# Patient Record
Sex: Female | Born: 1990 | Race: White | Hispanic: No | Marital: Married | Smoking: Never smoker
Health system: Southern US, Community
[De-identification: ages and names within clinical notes are randomized; demographics above are authoritative.]

## PROBLEM LIST (undated history)

## (undated) DIAGNOSIS — Z87442 Personal history of urinary calculi: Secondary | ICD-10-CM

## (undated) DIAGNOSIS — Z862 Personal history of diseases of the blood and blood-forming organs and certain disorders involving the immune mechanism: Secondary | ICD-10-CM

## (undated) DIAGNOSIS — K589 Irritable bowel syndrome without diarrhea: Secondary | ICD-10-CM

## (undated) DIAGNOSIS — Z8632 Personal history of gestational diabetes: Secondary | ICD-10-CM

## (undated) DIAGNOSIS — R011 Cardiac murmur, unspecified: Secondary | ICD-10-CM

## (undated) DIAGNOSIS — Z8759 Personal history of other complications of pregnancy, childbirth and the puerperium: Secondary | ICD-10-CM

## (undated) DIAGNOSIS — Z8679 Personal history of other diseases of the circulatory system: Secondary | ICD-10-CM

## (undated) DIAGNOSIS — K580 Irritable bowel syndrome with diarrhea: Secondary | ICD-10-CM

## (undated) DIAGNOSIS — N200 Calculus of kidney: Secondary | ICD-10-CM

## (undated) DIAGNOSIS — Z87410 Personal history of cervical dysplasia: Secondary | ICD-10-CM

## (undated) DIAGNOSIS — L700 Acne vulgaris: Secondary | ICD-10-CM

## (undated) DIAGNOSIS — K219 Gastro-esophageal reflux disease without esophagitis: Secondary | ICD-10-CM

---
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
## 2010-07-23 LAB — URINALYSIS, ROUTINE W REFLEX MICROSCOPIC
Bilirubin Urine: NEGATIVE
Glucose, UA: NEGATIVE mg/dL
Hgb urine dipstick: NEGATIVE
Ketones, ur: NEGATIVE mg/dL
Nitrite: NEGATIVE
Protein, ur: NEGATIVE mg/dL
Specific Gravity, Urine: 1.024 (ref 1.005–1.030)
Urobilinogen, UA: 0.2 mg/dL (ref 0.0–1.0)
pH: 6 (ref 5.0–8.0)

## 2015-11-30 ENCOUNTER — Emergency Department (HOSPITAL_BASED_OUTPATIENT_CLINIC_OR_DEPARTMENT_OTHER)
Admission: EM | Admit: 2015-11-30 | Discharge: 2015-11-30 | Disposition: A | Discharge status: Home / Self Care | Payer: Managed Care, Other (non HMO) | Attending: Emergency Medicine | Admitting: Emergency Medicine

## 2015-11-30 ENCOUNTER — Encounter (HOSPITAL_BASED_OUTPATIENT_CLINIC_OR_DEPARTMENT_OTHER): Payer: Self-pay | Admitting: *Deleted

## 2015-11-30 ENCOUNTER — Emergency Department (HOSPITAL_BASED_OUTPATIENT_CLINIC_OR_DEPARTMENT_OTHER): Payer: Managed Care, Other (non HMO)

## 2015-11-30 DIAGNOSIS — R1032 Left lower quadrant pain: Secondary | ICD-10-CM

## 2015-11-30 DIAGNOSIS — O26891 Other specified pregnancy related conditions, first trimester: Secondary | ICD-10-CM | POA: Diagnosis not present

## 2015-11-30 DIAGNOSIS — R102 Pelvic and perineal pain: Secondary | ICD-10-CM | POA: Diagnosis not present

## 2015-11-30 DIAGNOSIS — Z3490 Encounter for supervision of normal pregnancy, unspecified, unspecified trimester: Secondary | ICD-10-CM

## 2015-11-30 LAB — CBC WITH DIFFERENTIAL/PLATELET
Basophils Absolute: 0.1 10*3/uL (ref 0.0–0.1)
Basophils Relative: 1 %
Eosinophils Absolute: 0.3 10*3/uL (ref 0.0–0.7)
Eosinophils Relative: 3 %
HCT: 37.1 % (ref 36.0–46.0)
Hemoglobin: 13.1 g/dL (ref 12.0–15.0)
Lymphocytes Relative: 21 %
Lymphs Abs: 1.9 10*3/uL (ref 0.7–4.0)
MCH: 29.2 pg (ref 26.0–34.0)
MCHC: 35.3 g/dL (ref 30.0–36.0)
MCV: 82.6 fL (ref 78.0–100.0)
Monocytes Absolute: 0.4 10*3/uL (ref 0.1–1.0)
Monocytes Relative: 4 %
Neutro Abs: 6.6 10*3/uL (ref 1.7–7.7)
Neutrophils Relative %: 71 %
Platelets: 220 10*3/uL (ref 150–400)
RBC: 4.49 MIL/uL (ref 3.87–5.11)
RDW: 12.4 % (ref 11.5–15.5)
WBC: 9.2 10*3/uL (ref 4.0–10.5)

## 2015-11-30 LAB — URINE MICROSCOPIC-ADD ON
RBC / HPF: NONE SEEN RBC/hpf (ref 0–5)
WBC, UA: NONE SEEN WBC/hpf (ref 0–5)

## 2015-11-30 LAB — URINALYSIS, ROUTINE W REFLEX MICROSCOPIC
Bilirubin Urine: NEGATIVE
Glucose, UA: NEGATIVE mg/dL
Hgb urine dipstick: NEGATIVE
Ketones, ur: NEGATIVE mg/dL
Leukocytes, UA: NEGATIVE
Nitrite: NEGATIVE
Protein, ur: 30 mg/dL — AB
Specific Gravity, Urine: 1.022 (ref 1.005–1.030)
pH: 8.5 — ABNORMAL HIGH (ref 5.0–8.0)

## 2015-11-30 LAB — WET PREP, GENITAL
Clue Cells Wet Prep HPF POC: NONE SEEN
Sperm: NONE SEEN
Trich, Wet Prep: NONE SEEN
Yeast Wet Prep HPF POC: NONE SEEN

## 2015-11-30 LAB — COMPREHENSIVE METABOLIC PANEL
ALT: 11 U/L — ABNORMAL LOW (ref 14–54)
AST: 17 U/L (ref 15–41)
Albumin: 4.4 g/dL (ref 3.5–5.0)
Alkaline Phosphatase: 44 U/L (ref 38–126)
Anion gap: 7 (ref 5–15)
BUN: 9 mg/dL (ref 6–20)
CO2: 25 mmol/L (ref 22–32)
Calcium: 8.9 mg/dL (ref 8.9–10.3)
Chloride: 105 mmol/L (ref 101–111)
Creatinine, Ser: 0.57 mg/dL (ref 0.44–1.00)
GFR calc Af Amer: >60 mL/min (ref >60)
GFR calc non Af Amer: >60 mL/min (ref >60)
Glucose, Bld: 98 mg/dL (ref 65–99)
Potassium: 3.6 mmol/L (ref 3.5–5.1)
Sodium: 137 mmol/L (ref 135–145)
Total Bilirubin: 0.4 mg/dL (ref 0.3–1.2)
Total Protein: 7.1 g/dL (ref 6.5–8.1)

## 2015-11-30 LAB — PREGNANCY, URINE: Preg Test, Ur: POSITIVE — AB

## 2015-11-30 LAB — HCG, QUANTITATIVE, PREGNANCY: hCG, Beta Chain, Quant, S: 622 m[IU]/mL — ABNORMAL HIGH (ref <5)

## 2015-11-30 MED ORDER — SODIUM CHLORIDE 0.9 % IV BOLUS (SEPSIS)
1000.0000 mL | Freq: Once | INTRAVENOUS | Status: AC
  Administered 2015-11-30: 1000 mL via INTRAVENOUS

## 2015-11-30 NOTE — Discharge Instructions (Signed)
You have been seen today for abdominal pain. Your pregnancy test was positive. Your other lab results showed no significant abnormalities. The ultrasound showed an intrauterine pregnancy. There is some free fluid in the pelvis with a variety of possible causes. Recommend following up with OB/GYN as soon as possible to establish prenatal care as well as quantitative hCG monitoring. It is very important that you see the OB/GYN within the next 2 weeks. [facility] to [facility] should symptoms worsen or become more frequent.

## 2015-11-30 NOTE — ED Provider Notes (Signed)
[facility] Provider Note   CSN: [id number] Arrival date & time: 11/30/15  1613     History   Chief Complaint Chief Complaint  Patient presents with  . Abdominal Pain    HPI April Charles is a 25 y.o. female.  HPI   April Charles is a 25 y.o. female, patient with no pertinent past medical history, presenting to the ED with abdominal pain that began last week. Patient states that the pain is intermittent, sharp, located in the lower left quadrant, moderate to severe, radiating to the lower pelvis. Currently pain-free. There were 2 definite instances of this pain over the last week that the patient can remember. Patient states that she went to urgent care today, had a positive pregnancy test, and was told to come to the ED for further evaluation. Has never been pregnant before. Endorses some intermittent spotting last week. LMP April 1. Denies fever/chills, N/V/C/D, urinary complaints, abnormal vaginal discharge, or any other complaints.     History reviewed. No pertinent past medical history.  There are no active problems to display for this patient.   History reviewed. No pertinent surgical history.  OB History    No data available       Home Medications    Prior to Admission medications   Not on File    Family History No family history on file.  Social History Social History  Substance Use Topics  . Smoking status: Never Smoker  . Smokeless tobacco: Never Used  . Alcohol use No     Allergies   Review of patient's allergies indicates no known allergies.   Review of Systems Review of Systems  Constitutional: Negative for chills, diaphoresis and fever.  Gastrointestinal: Positive for abdominal pain. Negative for blood in stool, constipation, diarrhea, nausea and vomiting.  Genitourinary: Positive for pelvic pain. Negative for dysuria, flank pain, hematuria and vaginal discharge.  Neurological: Negative for dizziness and light-headedness.    All other systems reviewed and are negative.    Physical Exam Updated Vital Signs BP 144/93 (BP Location: Left Arm)   Pulse 82   Temp 99.1 F (37.3 C) (Oral)   Resp 18   Ht 5' (1.524 m)   Wt 51.7 kg   LMP 11/23/2015 Comment: she had a postive preg test [facility] today  SpO2 100%   BMI 22.26 kg/m   Physical Exam  Constitutional: She appears well-developed and well-nourished. No distress.  HENT:  Head: Normocephalic and atraumatic.  Eyes: Conjunctivae are normal.  Neck: Neck supple.  Cardiovascular: Normal rate, regular rhythm, normal heart sounds and intact distal pulses.   Pulmonary/Chest: Effort normal and breath sounds normal. No respiratory distress.  Abdominal: Soft. There is no tenderness. There is no guarding.  No pain currently. No tenderness to palpation.  Genitourinary:  Genitourinary Comments: External genitalia normal Vagina with discharge - a blend of thick, green discharge with some dark blood. Cervix  friable. Mucous plug appears to be intact. negative for cervical motion tenderness Adnexa palpated, no masses or negative for tenderness noted Bladder palpated negative for tenderness Uterus palpated no masses or negative for tenderness Otherwise normal female genitalia. RN, Windell Mouldinguth, served as chaperone during exam.  Musculoskeletal: She exhibits no edema or tenderness.  Lymphadenopathy:    She has no cervical adenopathy.  Neurological: She is alert.  Skin: Skin is warm and dry. She is not diaphoretic.  Psychiatric: She has a normal mood and affect. Her behavior is normal.  Nursing note and vitals reviewed.  ED Treatments / Results  Labs (all labs ordered are listed, but only abnormal results are displayed) Labs Reviewed  WET PREP, GENITAL - Abnormal; Notable for the following:       Result Value   WBC, Wet Prep HPF POC MODERATE (*)    All other components within normal limits  URINALYSIS, ROUTINE W REFLEX MICROSCOPIC (NOT AT [facility]) - Abnormal;  Notable for the following:    APPearance CLOUDY (*)    pH 8.5 (*)    Protein, ur 30 (*)    All other components within normal limits  PREGNANCY, URINE - Abnormal; Notable for the following:    Preg Test, Ur POSITIVE (*)    All other components within normal limits  HCG, QUANTITATIVE, PREGNANCY - Abnormal; Notable for the following:    hCG, Beta Chain, Quant, S 622 (*)    All other components within normal limits  COMPREHENSIVE METABOLIC PANEL - Abnormal; Notable for the following:    ALT 11 (*)    All other components within normal limits  URINE MICROSCOPIC-ADD ON - Abnormal; Notable for the following:    Squamous Epithelial / LPF 0-5 (*)    Bacteria, UA RARE (*)    All other components within normal limits  CBC WITH DIFFERENTIAL/PLATELET  GC/CHLAMYDIA PROBE AMP ([place]) NOT AT [facility]    EKG  EKG Interpretation None       Radiology [place] Ob Comp Less 14 Wks  Result Date: 11/30/2015 CLINICAL DATA:  Left pelvic pain.  First-trimester pregnancy. EXAM: OBSTETRIC <14 WK US AND TRANSVAGINAL OB US TECHNIQUE: Both transabdominal and transvaginal ultrasound examinations were performed for complete evaluation of the gestation as well as the maternal uterus, adnexal regions, and pelvic cul-de-sac. Transvaginal technique was performed to assess early pregnancy. COMPARISON:  None. FINDINGS: Intrauterine gestational sac: An intrauterine gestational sac is present. Yolk sac:  None visualized Embryo:  Not visualized Cardiac Activity: Not visualized MSD: 4.8  mm   5 w   2  d Subchorionic hemorrhage:  None visualized. Maternal uterus/adnexae: The uterus is retroverted. Multiple follicles are present in both ovaries. A large amount of free fluid is present. The uterus is retroverted. IMPRESSION: 1. Probable early intrauterine gestational sac, but no yolk sac, fetal pole, or cardiac activity yet visualized. Recommend follow-up quantitative B-HCG levels and follow-up US in 14 days to confirm and assess  viability. This recommendation follows SRU consensus guidelines: Diagnostic Criteria for Nonviable Pregnancy Early in the First Trimester. Malva Limes Engl J Med 2013; 161:0960-45; 369:1443-51. 2. Large amount free fluid may be physiologic. Electronically Signed   By: Marin Robertshristopher  Mattern M.D.   On: 11/30/2015 18:33   [place] Ob Transvaginal  Result Date: 11/30/2015 CLINICAL DATA:  Left pelvic pain.  First-trimester pregnancy. EXAM: OBSTETRIC <14 WK US AND TRANSVAGINAL OB US TECHNIQUE: Both transabdominal and transvaginal ultrasound examinations were performed for complete evaluation of the gestation as well as the maternal uterus, adnexal regions, and pelvic cul-de-sac. Transvaginal technique was performed to assess early pregnancy. COMPARISON:  None. FINDINGS: Intrauterine gestational sac: An intrauterine gestational sac is present. Yolk sac:  None visualized Embryo:  Not visualized Cardiac Activity: Not visualized MSD: 4.8  mm   5 w   2  d Subchorionic hemorrhage:  None visualized. Maternal uterus/adnexae: The uterus is retroverted. Multiple follicles are present in both ovaries. A large amount of free fluid is present. The uterus is retroverted. IMPRESSION: 1. Probable early intrauterine gestational sac, but no yolk sac, fetal pole, or cardiac activity yet visualized.  Recommend follow-up quantitative B-HCG levels and follow-up US in 14 days to confirm and assess viability. This recommendation follows SRU consensus guidelines: Diagnostic Criteria for Nonviable Pregnancy Early in the First Trimester. Malva Limes Med 2013; 161:0960-45. 2. Large amount free fluid may be physiologic. Electronically Signed   By: Marin Roberts M.D.   On: 11/30/2015 18:33    Procedures Procedures (including critical care time)  Medications Ordered in ED Medications  sodium chloride 0.9 % bolus 1,000 mL (0 mLs Intravenous Stopped 11/30/15 2000)     Initial Impression / Assessment and Plan / ED Course  I have reviewed the triage vital signs and  the nursing notes.  Pertinent labs & imaging results that were available during my care of the patient were reviewed by me and considered in my medical decision making (see chart for details).  Clinical Course    Kaityln Kallstrom presents with intermittent lower left abdominal pain over the last week.  Findings and plan of care discussed with Loren Racer, MD.   Rule out ectopic. Doubt ovarian torsion due to timeline. Patient has a benign abdominal exam. Pelvic exam findings as above. Patient's findings are not consistent with typical ectopic pregnancy. Intrauterine pregnancy noted on ultrasound. Patient is hemodynamically stable. Patient to follow-up with OB/GYN as soon as possible. The patient was given instructions for home care as well as return precautions. Patient voices understanding of these instructions, accepts the plan, and is comfortable with discharge.   Vitals:   11/30/15 1619 11/30/15 1621 11/30/15 1825  BP:  144/93 119/79  Pulse:  82 85  Resp:  18 18  Temp:  99.1 F (37.3 C)   TempSrc:  Oral   SpO2:  100% 100%  Weight: 51.7 kg    Height: 5' (1.524 m)        Final Clinical Impressions(s) / ED Diagnoses   Final diagnoses:  Left lower quadrant pain  Pregnancy    New Prescriptions New Prescriptions   No medications on file     Concepcion Living 11/30/15 2012    Loren Racer, MD 12/04/15 [phone]

## 2015-11-30 NOTE — ED Triage Notes (Signed)
Abdominal pain once last week. The pain came back today. The pain is throbbing in her lower abdomen.

## 2015-12-01 LAB — GC/CHLAMYDIA PROBE AMP (~~LOC~~) NOT AT ARMC
Chlamydia: NEGATIVE
Neisseria Gonorrhea: NEGATIVE

## 2015-12-10 ENCOUNTER — Ambulatory Visit (HOSPITAL_COMMUNITY)
Admission: RE | Admit: 2015-12-10 | Discharge: 2015-12-10 | Disposition: A | Discharge status: Home / Self Care | Payer: Managed Care, Other (non HMO) | Source: Ambulatory Visit | Attending: Obstetrics and Gynecology | Admitting: Obstetrics and Gynecology

## 2015-12-10 ENCOUNTER — Encounter (HOSPITAL_COMMUNITY)
Admission: RE | Disposition: A | Discharge status: Home / Self Care | Payer: Self-pay | Source: Ambulatory Visit | Attending: Obstetrics and Gynecology

## 2015-12-10 ENCOUNTER — Encounter (HOSPITAL_COMMUNITY): Payer: Self-pay | Admitting: *Deleted

## 2015-12-10 ENCOUNTER — Ambulatory Visit (HOSPITAL_COMMUNITY): Payer: Managed Care, Other (non HMO) | Admitting: Anesthesiology

## 2015-12-10 DIAGNOSIS — O001 Tubal pregnancy without intrauterine pregnancy: Secondary | ICD-10-CM | POA: Insufficient documentation

## 2015-12-10 DIAGNOSIS — O009 Unspecified ectopic pregnancy without intrauterine pregnancy: Secondary | ICD-10-CM

## 2015-12-10 DIAGNOSIS — N803 Endometriosis of pelvic peritoneum: Secondary | ICD-10-CM | POA: Insufficient documentation

## 2015-12-10 DIAGNOSIS — Z9079 Acquired absence of other genital organ(s): Secondary | ICD-10-CM

## 2015-12-10 HISTORY — DX: Cardiac murmur, unspecified: R01.1

## 2015-12-10 HISTORY — PX: LAPAROSCOPY: SHX197

## 2015-12-10 HISTORY — DX: Irritable bowel syndrome without diarrhea: K58.9

## 2015-12-10 LAB — CBC
HCT: 38.3 % (ref 36.0–46.0)
Hemoglobin: 13.5 g/dL (ref 12.0–15.0)
MCH: 28.7 pg (ref 26.0–34.0)
MCHC: 35.2 g/dL (ref 30.0–36.0)
MCV: 81.3 fL (ref 78.0–100.0)
Platelets: 220 10*3/uL (ref 150–400)
RBC: 4.71 MIL/uL (ref 3.87–5.11)
RDW: 12.8 % (ref 11.5–15.5)
WBC: 10.1 10*3/uL (ref 4.0–10.5)

## 2015-12-10 LAB — TYPE AND SCREEN
ABO/RH(D): O POS
Antibody Screen: NEGATIVE

## 2015-12-10 LAB — ABO/RH: ABO/RH(D): O POS

## 2015-12-10 SURGERY — LAPAROSCOPY, DIAGNOSTIC
Anesthesia: General

## 2015-12-10 MED ORDER — MEPERIDINE HCL 25 MG/ML IJ SOLN: 6.2500 mg | INTRAMUSCULAR | Status: DC | PRN

## 2015-12-10 MED ORDER — LIDOCAINE HCL (CARDIAC) 20 MG/ML IV SOLN
INTRAVENOUS | Status: DC | PRN
  Administered 2015-12-10: 60 mg via INTRAVENOUS

## 2015-12-10 MED ORDER — PROPOFOL 10 MG/ML IV BOLUS
INTRAVENOUS | Status: AC
  Filled 2015-12-10: qty 20

## 2015-12-10 MED ORDER — HYDROMORPHONE HCL 1 MG/ML IJ SOLN
INTRAMUSCULAR | Status: DC | PRN
  Administered 2015-12-10: 1 mg via INTRAVENOUS

## 2015-12-10 MED ORDER — MIDAZOLAM HCL 2 MG/2ML IJ SOLN
INTRAMUSCULAR | Status: DC | PRN
  Administered 2015-12-10: 2 mg via INTRAVENOUS

## 2015-12-10 MED ORDER — DEXAMETHASONE SODIUM PHOSPHATE 10 MG/ML IJ SOLN
INTRAMUSCULAR | Status: DC | PRN
  Administered 2015-12-10: 5 mg via INTRAVENOUS

## 2015-12-10 MED ORDER — LIDOCAINE HCL (CARDIAC) 20 MG/ML IV SOLN
INTRAVENOUS | Status: AC
  Filled 2015-12-10: qty 5

## 2015-12-10 MED ORDER — FENTANYL CITRATE (PF) 100 MCG/2ML IJ SOLN
INTRAMUSCULAR | Status: AC
  Filled 2015-12-10: qty 2

## 2015-12-10 MED ORDER — LACTATED RINGERS IV SOLN
INTRAVENOUS | Status: DC
  Administered 2015-12-10 (×2): via INTRAVENOUS

## 2015-12-10 MED ORDER — SCOPOLAMINE 1 MG/3DAYS TD PT72
MEDICATED_PATCH | TRANSDERMAL | Status: AC
  Administered 2015-12-10: 1.5 mg via TRANSDERMAL
  Filled 2015-12-10: qty 1

## 2015-12-10 MED ORDER — SUGAMMADEX SODIUM 500 MG/5ML IV SOLN
INTRAVENOUS | Status: AC
  Filled 2015-12-10: qty 5

## 2015-12-10 MED ORDER — SUGAMMADEX SODIUM 200 MG/2ML IV SOLN
INTRAVENOUS | Status: DC | PRN
  Administered 2015-12-10: 200 mg via INTRAVENOUS

## 2015-12-10 MED ORDER — LACTATED RINGERS IV SOLN: INTRAVENOUS | Status: DC

## 2015-12-10 MED ORDER — DEXAMETHASONE SODIUM PHOSPHATE 4 MG/ML IJ SOLN
INTRAMUSCULAR | Status: AC
  Filled 2015-12-10: qty 1

## 2015-12-10 MED ORDER — ONDANSETRON HCL 4 MG/2ML IJ SOLN
INTRAMUSCULAR | Status: AC
  Filled 2015-12-10: qty 2

## 2015-12-10 MED ORDER — OXYCODONE-ACETAMINOPHEN 5-325 MG PO TABS: 1.0000 | ORAL_TABLET | ORAL | 0 refills | Status: DC | PRN

## 2015-12-10 MED ORDER — PROPOFOL 10 MG/ML IV BOLUS
INTRAVENOUS | Status: DC | PRN
  Administered 2015-12-10: 40 mg via INTRAVENOUS
  Administered 2015-12-10: 160 mg via INTRAVENOUS

## 2015-12-10 MED ORDER — METOCLOPRAMIDE HCL 5 MG/ML IJ SOLN: 10.0000 mg | Freq: Once | INTRAMUSCULAR | Status: DC | PRN

## 2015-12-10 MED ORDER — ROCURONIUM BROMIDE 100 MG/10ML IV SOLN
INTRAVENOUS | Status: DC | PRN
  Administered 2015-12-10: 10 mg via INTRAVENOUS
  Administered 2015-12-10: 30 mg via INTRAVENOUS

## 2015-12-10 MED ORDER — FENTANYL CITRATE (PF) 100 MCG/2ML IJ SOLN
INTRAMUSCULAR | Status: DC | PRN
  Administered 2015-12-10: 50 ug via INTRAVENOUS
  Administered 2015-12-10: 100 ug via INTRAVENOUS
  Administered 2015-12-10: 50 ug via INTRAVENOUS

## 2015-12-10 MED ORDER — MIDAZOLAM HCL 2 MG/2ML IJ SOLN
INTRAMUSCULAR | Status: AC
  Filled 2015-12-10: qty 2

## 2015-12-10 MED ORDER — SUGAMMADEX SODIUM 200 MG/2ML IV SOLN
INTRAVENOUS | Status: AC
  Filled 2015-12-10: qty 2

## 2015-12-10 MED ORDER — BUPIVACAINE HCL (PF) 0.25 % IJ SOLN
INTRAMUSCULAR | Status: DC | PRN
  Administered 2015-12-10: 10 mL

## 2015-12-10 MED ORDER — SCOPOLAMINE 1 MG/3DAYS TD PT72
1.0000 | MEDICATED_PATCH | Freq: Once | TRANSDERMAL | Status: DC
  Administered 2015-12-10: 1.5 mg via TRANSDERMAL

## 2015-12-10 MED ORDER — HYDROMORPHONE HCL 1 MG/ML IJ SOLN
INTRAMUSCULAR | Status: AC
  Filled 2015-12-10: qty 1

## 2015-12-10 MED ORDER — FENTANYL CITRATE (PF) 100 MCG/2ML IJ SOLN: 25.0000 ug | INTRAMUSCULAR | Status: DC | PRN

## 2015-12-10 MED ORDER — ONDANSETRON HCL 4 MG/2ML IJ SOLN
INTRAMUSCULAR | Status: DC | PRN
  Administered 2015-12-10: 4 mg via INTRAVENOUS

## 2015-12-10 MED ORDER — ROCURONIUM BROMIDE 100 MG/10ML IV SOLN
INTRAVENOUS | Status: AC
  Filled 2015-12-10: qty 1

## 2015-12-10 MED ORDER — DEXAMETHASONE SODIUM PHOSPHATE 10 MG/ML IJ SOLN
INTRAMUSCULAR | Status: AC
  Filled 2015-12-10: qty 6

## 2015-12-10 MED ORDER — KETOROLAC TROMETHAMINE 30 MG/ML IJ SOLN
INTRAMUSCULAR | Status: DC | PRN
  Administered 2015-12-10: 30 mg via INTRAVENOUS

## 2015-12-10 SURGICAL SUPPLY — 35 items
BAG SPEC RTRVL LRG 6X4 10 (ENDOMECHANICALS) ×1
CABLE HIGH FREQUENCY MONO STRZ (ELECTRODE) IMPLANT
CATH ROBINSON RED A/P 16FR (CATHETERS) ×1 IMPLANT
CLOTH BEACON ORANGE TIMEOUT ST (SAFETY) ×2 IMPLANT
DECANTER SPIKE VIAL GLASS SM (MISCELLANEOUS) ×2 IMPLANT
DRSG COVADERM PLUS 2X2 (GAUZE/BANDAGES/DRESSINGS) ×2 IMPLANT
DRSG OPSITE POSTOP 3X4 (GAUZE/BANDAGES/DRESSINGS) ×1 IMPLANT
DURAPREP 26ML APPLICATOR (WOUND CARE) ×2 IMPLANT
FILTER SMOKE EVAC LAPAROSHD (FILTER) ×1 IMPLANT
GLOVE BIO SURGEON STRL SZ 6.5 (GLOVE) ×2 IMPLANT
GLOVE BIOGEL PI IND STRL 7.0 (GLOVE) ×1 IMPLANT
GLOVE BIOGEL PI INDICATOR 7.0 (GLOVE) ×1
GOWN STRL REUS W/TWL LRG LVL3 (GOWN DISPOSABLE) ×4 IMPLANT
LIQUID BAND (GAUZE/BANDAGES/DRESSINGS) ×2 IMPLANT
NEEDLE INSUFFLATION 120MM (ENDOMECHANICALS) ×1 IMPLANT
NS IRRIG 1000ML POUR BTL (IV SOLUTION) ×2 IMPLANT
PACK LAPAROSCOPY BASIN (CUSTOM PROCEDURE TRAY) ×2 IMPLANT
PAD TRENDELENBURG POSITION (MISCELLANEOUS) ×2 IMPLANT
POUCH SPECIMEN RETRIEVAL 10MM (ENDOMECHANICALS) ×1 IMPLANT
SET IRRIG TUBING LAPAROSCOPIC (IRRIGATION / IRRIGATOR) IMPLANT
SHEARS HARMONIC ACE PLUS 36CM (ENDOMECHANICALS) ×1 IMPLANT
SLEEVE XCEL OPT CAN 5 100 (ENDOMECHANICALS) ×2 IMPLANT
SOLUTION ELECTROLUBE (MISCELLANEOUS) IMPLANT
SUT MNCRL AB 4-0 PS2 18 (SUTURE) ×1 IMPLANT
SUT VIC AB 3-0 PS2 18 (SUTURE)
SUT VIC AB 3-0 PS2 18XBRD (SUTURE) ×1 IMPLANT
SUT VICRYL 0 UR6 27IN ABS (SUTURE) ×1 IMPLANT
SUT VICRYL 4-0 PS2 18IN ABS (SUTURE) ×1 IMPLANT
SYRINGE 60CC LL (MISCELLANEOUS) ×1 IMPLANT
SYSTEM CARTER THOMASON II (TROCAR) ×1 IMPLANT
TOWEL OR 17X24 6PK STRL BLUE (TOWEL DISPOSABLE) ×4 IMPLANT
TROCAR XCEL NON-BLD 11X100MML (ENDOMECHANICALS) ×1 IMPLANT
TROCAR XCEL NON-BLD 5MMX100MML (ENDOMECHANICALS) ×1 IMPLANT
WARMER LAPAROSCOPE (MISCELLANEOUS) ×2 IMPLANT
WATER STERILE IRR 1000ML POUR (IV SOLUTION) ×2 IMPLANT

## 2015-12-10 NOTE — Anesthesia Postprocedure Evaluation (Signed)
Anesthesia Post Note  Patient: April BradleyStacey Charles  Procedure(s) Performed: Procedure(s) (LRB): Operative Laparoscopy, Left Salpingectomy (N/A)  Patient location during evaluation: PACU Anesthesia Type: General Level of consciousness: awake and alert Pain management: pain level controlled Vital Signs Assessment: post-procedure vital signs reviewed and stable Respiratory status: spontaneous breathing, nonlabored ventilation and respiratory function stable Cardiovascular status: blood pressure returned to baseline and stable Postop Assessment: no signs of nausea or vomiting Anesthetic complications: no     Last Vitals:  Vitals:   12/10/15 1830 12/10/15 1845  BP: 117/77 118/77  Pulse: 86 83  Resp: 10 12  Temp:      Last Pain:  Vitals:   12/10/15 1524  TempSrc: Oral   Pain Goal: Patients Stated Pain Goal: 3 (12/10/15 1524)               [name]

## 2015-12-10 NOTE — Discharge Planning (Signed)
Pt felt great in PACU. Pt offered to stay overnight for extended observation vs discharge to home. Pt would like to go home tonight. Family agrees.  Instructions reviewed Rx for percocet and ibuprofen given Call office to schedule for f/u appt in a week All questions answered

## 2015-12-10 NOTE — Anesthesia Procedure Notes (Signed)
Procedure Name: Intubation Date/Time: 12/10/2015 4:59 PM Performed by: Elbert EwingsHYMER, Kamaryn Grimley S Pre-anesthesia Checklist: Patient identified, Emergency Drugs available, Suction available, Patient being monitored and Timeout performed Patient Re-evaluated:Patient Re-evaluated prior to inductionOxygen Delivery Method: Circle system utilized Preoxygenation: Pre-oxygenation with 100% oxygen Intubation Type: IV induction Ventilation: Mask ventilation without difficulty Laryngoscope Size: Mac and 3 Grade View: Grade II Tube type: Oral Tube size: 7.0 mm Number of attempts: 1 Airway Equipment and Method: Stylet Placement Confirmation: ETT inserted through vocal cords under direct vision,  positive ETCO2 and breath sounds checked- equal and bilateral Secured at: 20 cm Tube secured with: Tape Dental Injury: Teeth and Oropharynx as per pre-operative assessment

## 2015-12-10 NOTE — Transfer of Care (Signed)
Immediate Anesthesia Transfer of Care Note  Patient: April BradleyStacey Nelis  Procedure(s) Performed: Procedure(s): Operative Laparoscopy, Left Salpingectomy (N/A)  Patient Location: PACU  Anesthesia Type:General  Level of Consciousness: awake, alert  and oriented  Airway & Oxygen Therapy: Patient Spontanous Breathing and Patient connected to nasal cannula oxygen  Post-op Assessment: Report given to RN and Post -op Vital signs reviewed and stable  Post vital signs: Reviewed and stable  Last Vitals:  Vitals:   12/10/15 1524  BP: 119/89  Pulse: 79  Resp: 18  Temp: 37.3 C    Last Pain:  Vitals:   12/10/15 1524  TempSrc: Oral      Patients Stated Pain Goal: 3 (12/10/15 1524)  Complications: No apparent anesthesia complications

## 2015-12-10 NOTE — Discharge Instructions (Signed)
DISCHARGE INSTRUCTIONS: Laparoscopy  The following instructions have been prepared to help you care for yourself upon your return home today.  Wound care:  Do not get the incision wet for the first 24 hours. The incision should be kept clean and dry.  The Band-Aids or dressings may be removed the day after surgery.  Should the incision become sore, red, and swollen after the first week, check with your doctor.  Personal hygiene:  Shower the day after your procedure. (Saturday)  Activity and limitations:  Do NOT drive or operate any equipment today.  Do NOT lift anything more than 15 pounds for 2-3 weeks after surgery.  Do NOT rest in bed all day.  Walking is encouraged. Walk each day, starting slowly with 5-minute walks 3 or 4 times a day. Slowly increase the length of your walks.  Walk up and down stairs slowly.  Do NOT do strenuous activities, such as golfing, playing tennis, bowling, running, biking, weight lifting, gardening, mowing, or vacuuming for 2-4 weeks. Ask your doctor when it is okay to start.  Diet: Eat a light meal as desired this evening. You may resume your usual diet tomorrow.  Return to work: This is dependent on the type of work you do. For the most part you can return to a desk job within a week of surgery. If you are more active at work, please discuss this with your doctor.  What to expect after your surgery: You may have a slight burning sensation when you urinate on the first day. You may have a very small amount of blood in the urine. Expect to have a small amount of vaginal discharge/light bleeding for 1-2 weeks. It is not unusual to have abdominal soreness and bruising for up to 2 weeks. You may be tired and need more rest for about 1 week. You may experience shoulder pain for 24-72 hours. Lying flat in bed may relieve it.  Call your doctor for any of the following:  Develop a fever of 100.4 or greater  Inability to urinate 6 hours after discharge  from hospital  Severe pain not relieved by pain medications  Persistent of heavy bleeding at incision site  Redness or swelling around incision site after a week  Increasing nausea or vomiting  Patient Signature________________________________________ Nurse Signature_________________________________________ Post Anesthesia Home Care Instructions  Activity: Get plenty of rest for the remainder of the day. A responsible adult should stay with you for 24 hours following the procedure.  For the next 24 hours, DO NOT: -Drive a car -Advertising copywriter -Drink alcoholic beverages -Take any medication unless instructed by your physician -Make any legal decisions or sign important papers.  Meals: Start with liquid foods such as gelatin or soup. Progress to regular foods as tolerated. Avoid greasy, spicy, heavy foods. If nausea and/or vomiting occur, drink only clear liquids until the nausea and/or vomiting subsides. Call your physician if vomiting continues.  Special Instructions/Symptoms: Your throat may feel dry or sore from the anesthesia or the breathing tube placed in your throat during surgery. If this causes discomfort, gargle with warm salt water. The discomfort should disappear within 24 hours.  If you had a scopolamine patch placed behind your ear for the management of post- operative nausea and/or vomiting:  1. The medication in the patch is effective for 72 hours, after which it should be removed.  Wrap patch in a tissue and discard in the trash. Wash hands thoroughly with soap and water. 2. You may remove the  patch earlier than 72 hours if you experience unpleasant side effects which may include dry mouth, dizziness or visual disturbances. 3. Avoid touching the patch. Wash your hands with soap and water after contact with the patch.

## 2015-12-10 NOTE — H&P (Signed)
April BradleyStacey Charles is an 25 y.o. female here for scheduled laparoscopy due to ectopic pregnancy. Pt was in office today for US to f/u abnormally rising beta. Last Tues 11/30/15 began to have left pain and went to ED. US Showed nothing definitive and beta was 600. Returned to office 12/02/15 and beta was 812 then 1460 12/05/15 then 1980 12/08/15. With this abnormally rising beta she was brought in for US today. US is c/w a left ectopic pregnancy 2x1.4x1.2 cm. There is some complex free fluid in adnexa and cul-de-sac. She is currently not having significant pain, but did last week. She has been trying to conceive for 3 years. Menses have been irregular 21-35. Pt was counselled and given option of methotrexate vs laparoscopy and she opted for laparoscopy. R/B reviewed and all questions answered  Pertinent Gynecological History: Menses: irregular occurring approximately every 21-35 days with spotting approximately 2 days per month Bleeding: none Contraception: none DES exposure: denies Blood transfusions: none Sexually transmitted diseases: no past history Previous GYN Procedures: none  Last mammogram: n/a Date:  Last pap: normal Date: 11/10/14 OB History: G0, P0   Menstrual History: Menarche age: [age] Patient's last menstrual period was 11/23/2015.    Past Medical History:  Diagnosis Date  . Heart murmur    Hx as a child, no problems as an adult  . IBS (irritable bowel syndrome)    no meds    History reviewed. No pertinent surgical history.  History reviewed. No pertinent family history.  Social History:  reports that she has never smoked. She has never used smokeless tobacco. She reports that she does not drink alcohol or use drugs.  Allergies: No Known Allergies  Prescriptions Prior to Admission  Medication Sig Dispense Refill Last Dose  . ibuprofen (ADVIL,MOTRIN) 200 MG tablet Take 200 mg by mouth every 6 (six) hours as needed for mild pain.   11/30/2015  . Prenatal Vit-Fe Fumarate-FA  (PRENATAL MULTIVITAMIN) TABS tablet Take 1 tablet by mouth daily at 12 noon.   12/09/2015 at Unknown time    Review of Systems  Constitutional: Negative for chills, fever, malaise/fatigue and weight loss.  Eyes: Negative for blurred vision.  Respiratory: Negative for shortness of breath.   Cardiovascular: Negative for chest pain.  Gastrointestinal: Positive for abdominal pain. Negative for heartburn, nausea and vomiting.  Genitourinary: Negative for dysuria.  Musculoskeletal: Negative for back pain and myalgias.  Skin: Negative for itching and rash.  Neurological: Negative for dizziness and headaches.  Endo/Heme/Allergies: Does not bruise/bleed easily.  Psychiatric/Behavioral: Negative for depression, hallucinations, substance abuse and suicidal ideas. The patient is not nervous/anxious.     Height 5' (1.524 m), weight 114 lb (51.7 kg), last menstrual period 11/23/2015. Physical Exam  Constitutional: She is oriented to person, place, and time. She appears well-developed and well-nourished.  HENT:  Head: Normocephalic.  Neck: Normal range of motion.  Cardiovascular: Normal rate.   Respiratory: Effort normal.  GI: Soft. She exhibits no distension. There is tenderness. There is no rebound and no guarding.  Musculoskeletal: Normal range of motion.  Neurological: She is alert and oriented to person, place, and time.  Skin: Skin is warm.  Psychiatric: She has a normal mood and affect. Her behavior is normal. Judgment and thought content normal.    No results found for this or any previous visit (from the past 24 hour(s)).  No results found.  Assessment/Plan: G1P0 with left adnexal mass consistent with ectopic pregnancy opting for surgical management R/B reviewed and all questions answered ;  including risk of open, injury and bleeding and loss of tube/ovary Consent confirmed TO OR for Diagnostic laparoscopy, removal of ectopic pregnancy, possible left salpingectomy, possible  open  Sharol Given Alizah Sills 12/10/2015, 3:21 PM

## 2015-12-10 NOTE — Anesthesia Preprocedure Evaluation (Signed)
Anesthesia Evaluation  Patient identified by MRN, date of birth, ID band Patient awake    Reviewed: Allergy & Precautions, NPO status , Patient's Chart, lab work & pertinent test results  Airway Mallampati: II  TM Distance: >3 FB Neck ROM: Full    Dental no notable dental hx.    Pulmonary neg pulmonary ROS,    Pulmonary exam normal breath sounds clear to auscultation       Cardiovascular negative cardio ROS Normal cardiovascular exam Rhythm:Regular Rate:Normal     Neuro/Psych negative neurological ROS  negative psych ROS   GI/Hepatic negative GI ROS, Neg liver ROS,   Endo/Other  negative endocrine ROS  Renal/GU negative Renal ROS  negative genitourinary   Musculoskeletal negative musculoskeletal ROS (+)   Abdominal   Peds negative pediatric ROS (+)  Hematology negative hematology ROS (+)   Anesthesia Other Findings   Reproductive/Obstetrics (+) Pregnancy ectopic                             Anesthesia Physical Anesthesia Plan  ASA: II  Anesthesia Plan: General   Post-op Pain Management:    Induction: Intravenous  Airway Management Planned: Oral ETT  Additional Equipment:   Intra-op Plan:   Post-operative Plan: Extubation in OR  Informed Consent: I have reviewed the patients History and Physical, chart, labs and discussed the procedure including the risks, benefits and alternatives for the proposed anesthesia with the patient or authorized representative who has indicated his/her understanding and acceptance.   Dental advisory given  Plan Discussed with: CRNA  Anesthesia Plan Comments:         Anesthesia Quick Evaluation

## 2015-12-10 NOTE — Op Note (Signed)
Operative Note    Preoperative Diagnosis: left tubal ectopic pregnancy   Postoperative Diagnosis:1.  left tubal ectopic pregnancy                                               2. endometrial implants   Procedure: Diagnostic laparoscopy with left salpingectomy   Surgeon: Dr Mindi SlickerBanga Assist:   Dr Ellyn HackBovard - Stuckert  Anesthesia: general   Fluids: LR EBL: 30cc UOP: small    Findings: Normal sized uterus and ovaries and right fallopian tube; left fallopian tube with 4cm ectopic in mid isthmus position; blood in cul de sac and noted over organs; moderate endometrial implants in posterior cul de sac and small amount in anterior cul de sac as well as behind left ovary; none behind right ovary   Specimen: left fallopian tube and ectopic pregnancy   Procedure Note  Patient was taken to the operating room where general anesthesia was obtained without difficulty in a comfortable dorsal lithotomy position. She was then prepped and draped in the normal sterile fashion. An appropriate timeout was performed. A speculum was then placed within the vagina and a Hulka tenaculum placed within the cervix for uterine manipulation. The bladder was emptied. Attention was then turned to the patient's abdomen after draping where the infraumbilicus was incised and a 5mm scope placed under direct visualization. Gas flow was then applied and a pneumoperitoneum obtained with approximate 3 L of CO2 gas. With patient in Trendelenburg the uterus and tubes and ovaries were inspected with findings as previously stated. A second 5mm scope was placed under direct visualization the right lower quadrant and a 11mm port in the left; both under direct visualization and after injection with 1/4% marcaine.  The uterus was anteverted and better assesment done which confirmed initial findings. Decision made to proceed with a salpingectomy as best surgical outcome hence the left fallopian tube and ectopic were excised using the harmonic  scalpel. An endo bag was then placed and specimen easily removed. The pelvis was was copious irrigated with saline with no bleeding noted. Both ureters were away from area of surgery.  At this time the patient was then flattened. The 11mm port site was closed using an o vicryl suture on a carter thomasen. The remaining instruments were all removed from the abdomen and the pneumoperitoneum reduced. The umbilical trocar was finally removed and the incisions closed with 4-0 Vicryl and dermabond.The hulka tenaculum was removed- hemostasis noted. Patient was then awakened and taken to the recovery room in good condition. Counts were correct

## 2015-12-14 ENCOUNTER — Encounter (HOSPITAL_COMMUNITY): Payer: Self-pay | Admitting: Obstetrics and Gynecology

## 2017-01-02 DIAGNOSIS — R1110 Vomiting, unspecified: Secondary | ICD-10-CM | POA: Diagnosis not present

## 2017-01-02 DIAGNOSIS — Z6821 Body mass index (BMI) 21.0-21.9, adult: Secondary | ICD-10-CM | POA: Diagnosis not present

## 2017-01-02 DIAGNOSIS — K529 Noninfective gastroenteritis and colitis, unspecified: Secondary | ICD-10-CM | POA: Diagnosis not present

## 2017-02-27 DIAGNOSIS — Z6822 Body mass index (BMI) 22.0-22.9, adult: Secondary | ICD-10-CM | POA: Diagnosis not present

## 2017-02-27 DIAGNOSIS — F411 Generalized anxiety disorder: Secondary | ICD-10-CM | POA: Diagnosis not present

## 2017-02-27 DIAGNOSIS — Z0000 Encounter for general adult medical examination without abnormal findings: Secondary | ICD-10-CM | POA: Diagnosis not present

## 2017-05-02 DIAGNOSIS — R946 Abnormal results of thyroid function studies: Secondary | ICD-10-CM | POA: Diagnosis not present

## 2017-05-29 DIAGNOSIS — J111 Influenza due to unidentified influenza virus with other respiratory manifestations: Secondary | ICD-10-CM | POA: Diagnosis not present

## 2017-05-29 DIAGNOSIS — J029 Acute pharyngitis, unspecified: Secondary | ICD-10-CM | POA: Diagnosis not present

## 2017-05-29 DIAGNOSIS — Z6823 Body mass index (BMI) 23.0-23.9, adult: Secondary | ICD-10-CM | POA: Diagnosis not present

## 2017-07-20 ENCOUNTER — Emergency Department (HOSPITAL_COMMUNITY)
Admission: EM | Admit: 2017-07-20 | Discharge: 2017-07-20 | Disposition: A | Discharge status: Home / Self Care | Payer: BLUE CROSS/BLUE SHIELD | Attending: Physician Assistant | Admitting: Physician Assistant

## 2017-07-20 ENCOUNTER — Emergency Department (HOSPITAL_COMMUNITY): Payer: BLUE CROSS/BLUE SHIELD

## 2017-07-20 ENCOUNTER — Encounter (HOSPITAL_COMMUNITY): Payer: Self-pay | Admitting: Emergency Medicine

## 2017-07-20 DIAGNOSIS — N23 Unspecified renal colic: Secondary | ICD-10-CM | POA: Diagnosis not present

## 2017-07-20 DIAGNOSIS — Z79899 Other long term (current) drug therapy: Secondary | ICD-10-CM | POA: Diagnosis not present

## 2017-07-20 DIAGNOSIS — N200 Calculus of kidney: Secondary | ICD-10-CM | POA: Diagnosis not present

## 2017-07-20 DIAGNOSIS — N202 Calculus of kidney with calculus of ureter: Secondary | ICD-10-CM | POA: Diagnosis not present

## 2017-07-20 DIAGNOSIS — R109 Unspecified abdominal pain: Secondary | ICD-10-CM | POA: Diagnosis not present

## 2017-07-20 DIAGNOSIS — R1032 Left lower quadrant pain: Secondary | ICD-10-CM | POA: Diagnosis present

## 2017-07-20 LAB — I-STAT CHEM 8, ED
BUN: 10 mg/dL (ref 6–20)
Calcium, Ion: 1.19 mmol/L (ref 1.15–1.40)
Chloride: 103 mmol/L (ref 101–111)
Creatinine, Ser: 0.6 mg/dL (ref 0.44–1.00)
Glucose, Bld: 93 mg/dL (ref 65–99)
HCT: 41 % (ref 36.0–46.0)
Hemoglobin: 13.9 g/dL (ref 12.0–15.0)
Potassium: 4.1 mmol/L (ref 3.5–5.1)
Sodium: 141 mmol/L (ref 135–145)
TCO2: 27 mmol/L (ref 22–32)

## 2017-07-20 LAB — URINALYSIS, ROUTINE W REFLEX MICROSCOPIC
Bilirubin Urine: NEGATIVE
Glucose, UA: NEGATIVE mg/dL
Hgb urine dipstick: NEGATIVE
Ketones, ur: NEGATIVE mg/dL
Nitrite: NEGATIVE
Protein, ur: 30 mg/dL — AB
Specific Gravity, Urine: 1.024 (ref 1.005–1.030)
pH: 6 (ref 5.0–8.0)

## 2017-07-20 LAB — POC URINE PREG, ED: Preg Test, Ur: NEGATIVE

## 2017-07-20 MED ORDER — OXYCODONE-ACETAMINOPHEN 5-325 MG PO TABS: ORAL_TABLET | ORAL | 0 refills | Status: DC

## 2017-07-20 MED ORDER — PROMETHAZINE HCL 25 MG PO TABS: 25.0000 mg | ORAL_TABLET | Freq: Four times a day (QID) | ORAL | 0 refills | Status: DC | PRN

## 2017-07-20 MED ORDER — TAMSULOSIN HCL 0.4 MG PO CAPS: 0.4000 mg | ORAL_CAPSULE | Freq: Two times a day (BID) | ORAL | 0 refills | Status: DC

## 2017-07-20 NOTE — Discharge Instructions (Addendum)
Return to the ED immediately if you develop fever, uncontrolled pain or vomiting, or other concerns. ° °

## 2017-07-20 NOTE — ED Provider Notes (Signed)
[facility]-EMERGENCY DEPT Provider Note   CSN: [id number] Arrival date & time: 07/20/17  1019     History   Chief Complaint Chief Complaint  Patient presents with  . Flank Pain    HPI April Charles is a 27 y.o. female who presents the emergency department chief complaint of flank pain.  Patient states that over the past 3 days she has had urgency of urination without much production of urine.  She is never had a urinary tract infection but thought she might have when it took over-the-counter Azo-Standard.  She is continued to have the symptoms.  This morning around 8 AM she had sudden onset of severe stabbing left-sided flank pain with associated diaphoresis and nausea.  She did not vomit.  She was unable to find a position of comfort.  This lasted for about an hour and then went away and returned again at about 10:30 AM lasting for another hour.  She has no complaints of pain at this time.  She has urinated about 3 times.  She denies fevers, chills.  She has no known history of kidney stones.  HPI  Past Medical History:  Diagnosis Date  . Heart murmur    Hx as a child, no problems as an adult  . IBS (irritable bowel syndrome)    no meds    Patient Active Problem List   Diagnosis Date Noted  . Ectopic pregnancy without intrauterine pregnancy 12/10/2015  . Hx of unilateral salpingectomy 12/10/2015    Past Surgical History:  Procedure Laterality Date  . LAPAROSCOPY N/A 12/10/2015   Procedure: Operative Laparoscopy, Left Salpingectomy;  Surgeon: Edwinna Areolaecilia Worema Banga, DO;  Location: [facility];  Service: Gynecology;  Laterality: N/A;     OB History   None      Home Medications    Prior to Admission medications   Medication Sig Start Date End Date Taking? Authorizing Provider  ibuprofen (ADVIL,MOTRIN) 200 MG tablet Take 200 mg by mouth every 6 (six) hours as needed for mild pain.    [provider]  oxyCODONE-acetaminophen (PERCOCET) 5-325 MG  tablet Take 1 tablet every 4 hours or 2 tablets every 6 hours as needed for severe pain. 07/20/17   Arthor CaptainHarris, Ala Capri, PA-C  Prenatal Vit-Fe Fumarate-FA (PRENATAL MULTIVITAMIN) TABS tablet Take 1 tablet by mouth daily at 12 noon.    [provider]  promethazine (PHENERGAN) 25 MG tablet Take 1 tablet (25 mg total) by mouth every 6 (six) hours as needed for nausea or vomiting. 07/20/17   Arthor CaptainHarris, Chanice Brenton, PA-C  tamsulosin (FLOMAX) 0.4 MG CAPS capsule Take 1 capsule (0.4 mg total) by mouth 2 (two) times daily. 07/20/17   Arthor CaptainHarris, Sheng Pritz, PA-C    Family History No family history on file.  Social History Social History   Tobacco Use  . Smoking status: Never Smoker  . Smokeless tobacco: Never Used  Substance Use Topics  . Alcohol use: No  . Drug use: No     Allergies   Patient has no known allergies.   Review of Systems Review of Systems Ten systems reviewed and are negative for acute change, except as noted in the HPI.    Physical Exam Updated Vital Signs BP 121/83 (BP Location: Left Arm)   Pulse 76   Temp 98.4 F (36.9 C) (Oral)   Resp 18   Ht 5' (1.524 m)   Wt 54 kg (119 lb)   LMP 07/15/2017 Comment: negative urine pregancy test 07-20-2017  SpO2 97%  BMI 23.24 kg/m   Physical Exam  Constitutional: She is oriented to person, place, and time. She appears well-developed and well-nourished. No distress.  HENT:  Head: Normocephalic and atraumatic.  Eyes: Conjunctivae are normal. No scleral icterus.  Neck: Normal range of motion.  Cardiovascular: Normal rate, regular rhythm and normal heart sounds. Exam reveals no gallop and no friction rub.  No murmur heard. Pulmonary/Chest: Effort normal and breath sounds normal. No respiratory distress.  Abdominal: Soft. Bowel sounds are normal. She exhibits no distension and no mass. There is no tenderness. There is no guarding.  Neurological: She is alert and oriented to person, place, and time.  Skin: Skin is warm and dry. She is  not diaphoretic.  Psychiatric: Her behavior is normal.  Nursing note and vitals reviewed.    ED Treatments / Results  Labs (all labs ordered are listed, but only abnormal results are displayed) Labs Reviewed  URINALYSIS, ROUTINE W REFLEX MICROSCOPIC - Abnormal; Notable for the following components:      Result Value   APPearance HAZY (*)    Protein, ur 30 (*)    Leukocytes, UA SMALL (*)    Bacteria, UA RARE (*)    Squamous Epithelial / LPF 6-30 (*)    All other components within normal limits  POC URINE PREG, ED  I-STAT CHEM 8, ED    EKG None  Radiology Ct Renal Stone Study  Result Date: 07/20/2017 CLINICAL DATA:  Left flank pain with urinary frequency. EXAM: CT ABDOMEN AND PELVIS WITHOUT CONTRAST TECHNIQUE: Multidetector CT imaging of the abdomen and pelvis was performed following the standard protocol without IV contrast. COMPARISON:  07/07/2006 FINDINGS: Lower chest: Unremarkable Hepatobiliary: No focal abnormality in the liver on this study without intravenous contrast. There is no evidence for gallstones, gallbladder wall thickening, or pericholecystic fluid. No intrahepatic or extrahepatic biliary dilation. Pancreas: No focal mass lesion. No dilatation of the main duct. No intraparenchymal cyst. No peripancreatic edema. Spleen: No splenomegaly. No focal mass lesion. Adrenals/Urinary Tract: No adrenal nodule or mass. No stones in the right kidney or ureter. Adjacent 1-2 mm nonobstructing stones identified lower pole left kidney. No substantial secondary changes in the left kidney or ureter although 1-2 mm distal left ureteral stone identified about 1 cm proximal to the UVJ (image 68/series 2 and sagittal image 101/series 5). No bladder stones. Stomach/Bowel: Stomach is nondistended. No gastric wall thickening. No evidence of outlet obstruction. Duodenum is normally positioned as is the ligament of Treitz. No small bowel wall thickening. No small bowel dilatation. The terminal ileum is  normal. The appendix is normal. No gross colonic mass. No colonic wall thickening. No substantial diverticular change. Vascular/Lymphatic: No abdominal aortic aneurysm. No abdominal aortic atherosclerotic calcification. There is no gastrohepatic or hepatoduodenal ligament lymphadenopathy. No intraperitoneal or retroperitoneal lymphadenopathy. No pelvic sidewall lymphadenopathy. Reproductive: The uterus has normal CT imaging appearance. There is no adnexal mass. Other: No intraperitoneal free fluid. Musculoskeletal: Bone windows reveal no worrisome lytic or sclerotic osseous lesions. IMPRESSION: 1. 1-2 mm distal left ureteral stone is identified approximately 1-2 cm proximal to the UVJ without appreciable secondary changes in the left kidney or ureter. 2. A pair of adjacent 1-2 mm stones identified in the lower pole the left kidney without obstruction. 3. Otherwise unremarkable exam. Electronically Signed   By: Kennith Center M.D.   On: 07/20/2017 15:27    Procedures Procedures (including critical care time)  Medications Ordered in ED Medications - No data to display   Initial Impression /  Assessment and Plan / ED Course  I have reviewed the triage vital signs and the nursing notes.  Pertinent labs & imaging results that were available during my care of the patient were reviewed by me and considered in my medical decision making (see chart for details).  Clinical Course as of Jul 21 1603  Fri Jul 20, 2017  1349 Normal creatinine.  I-stat chem 8, ed [AH]    Clinical Course User Index [AH] Arthor Captain, PA-C    Patient with 2 mm UVJ stone on the left and 2 kidney stones in the left kidney.  She has not had any repeat pain while she is here.  I discussed findings of the CT scan.  Her renal function appears normal.  There is no evidence of obstructive uropathy.  No evidence of infection.  I discussed the course that her pain can take.  Patient will be discharged with Percocet, Flomax and  Phenergan.  I discussed return precautions with the patient.  Final Clinical Impressions(s) / ED Diagnoses   Final diagnoses:  Ureteral colic  Kidney stone    ED Discharge Orders        Ordered    oxyCODONE-acetaminophen (PERCOCET) 5-325 MG tablet     07/20/17 1546    promethazine (PHENERGAN) 25 MG tablet  Every 6 hours PRN     07/20/17 1546    tamsulosin (FLOMAX) 0.4 MG CAPS capsule  2 times daily     07/20/17 1546       Arthor Captain, PA-C 07/20/17 1605    Mackuen, Cindee Salt, MD 07/20/17 1621

## 2017-07-20 NOTE — ED Triage Notes (Signed)
Per pt, nontraumatic left back pain-states urinary frequency-states she started taking AZO 3 days ago but doesn't think it helped-states left flank pain that started this am-thinks she might have UTI

## 2017-07-23 DIAGNOSIS — N202 Calculus of kidney with calculus of ureter: Secondary | ICD-10-CM | POA: Diagnosis not present

## 2017-07-24 DIAGNOSIS — N202 Calculus of kidney with calculus of ureter: Secondary | ICD-10-CM | POA: Diagnosis not present

## 2017-08-27 DIAGNOSIS — N202 Calculus of kidney with calculus of ureter: Secondary | ICD-10-CM | POA: Diagnosis not present

## 2017-09-13 DIAGNOSIS — N202 Calculus of kidney with calculus of ureter: Secondary | ICD-10-CM | POA: Diagnosis not present

## 2017-12-06 DIAGNOSIS — K580 Irritable bowel syndrome with diarrhea: Secondary | ICD-10-CM | POA: Diagnosis not present

## 2017-12-06 DIAGNOSIS — Z6824 Body mass index (BMI) 24.0-24.9, adult: Secondary | ICD-10-CM | POA: Diagnosis not present

## 2018-06-22 DIAGNOSIS — Z6825 Body mass index (BMI) 25.0-25.9, adult: Secondary | ICD-10-CM | POA: Diagnosis not present

## 2018-06-22 DIAGNOSIS — J101 Influenza due to other identified influenza virus with other respiratory manifestations: Secondary | ICD-10-CM | POA: Diagnosis not present

## 2018-12-27 DIAGNOSIS — Z3169 Encounter for other general counseling and advice on procreation: Secondary | ICD-10-CM | POA: Diagnosis not present

## 2018-12-27 DIAGNOSIS — N926 Irregular menstruation, unspecified: Secondary | ICD-10-CM | POA: Diagnosis not present

## 2019-01-17 ENCOUNTER — Other Ambulatory Visit: Payer: Self-pay

## 2019-01-17 DIAGNOSIS — Z20822 Contact with and (suspected) exposure to covid-19: Secondary | ICD-10-CM

## 2019-01-18 LAB — NOVEL CORONAVIRUS, NAA: SARS-CoV-2, NAA: NOT DETECTED

## 2019-01-21 ENCOUNTER — Telehealth: Payer: Self-pay | Admitting: General Practice

## 2019-01-21 NOTE — Telephone Encounter (Signed)
Pt returned call for covid result °Advised of Not Detected result.  °

## 2019-05-02 ENCOUNTER — Other Ambulatory Visit: Payer: Self-pay

## 2019-05-02 ENCOUNTER — Ambulatory Visit: Payer: BC Managed Care – PPO | Attending: Internal Medicine

## 2019-05-02 DIAGNOSIS — Z20822 Contact with and (suspected) exposure to covid-19: Secondary | ICD-10-CM

## 2019-05-02 DIAGNOSIS — U071 COVID-19: Secondary | ICD-10-CM | POA: Insufficient documentation

## 2019-05-03 LAB — NOVEL CORONAVIRUS, NAA: SARS-CoV-2, NAA: DETECTED — AB

## 2019-09-12 DIAGNOSIS — Z3181 Encounter for male factor infertility in female patient: Secondary | ICD-10-CM | POA: Diagnosis not present

## 2019-09-12 DIAGNOSIS — Z319 Encounter for procreative management, unspecified: Secondary | ICD-10-CM | POA: Diagnosis not present

## 2019-09-12 DIAGNOSIS — E288 Other ovarian dysfunction: Secondary | ICD-10-CM | POA: Diagnosis not present

## 2019-09-12 DIAGNOSIS — N912 Amenorrhea, unspecified: Secondary | ICD-10-CM | POA: Diagnosis not present

## 2019-10-07 DIAGNOSIS — Z3202 Encounter for pregnancy test, result negative: Secondary | ICD-10-CM | POA: Diagnosis not present

## 2019-10-07 DIAGNOSIS — Z3141 Encounter for fertility testing: Secondary | ICD-10-CM | POA: Diagnosis not present

## 2019-10-24 DIAGNOSIS — Z319 Encounter for procreative management, unspecified: Secondary | ICD-10-CM | POA: Diagnosis not present

## 2019-11-18 DIAGNOSIS — Z3201 Encounter for pregnancy test, result positive: Secondary | ICD-10-CM | POA: Diagnosis not present

## 2019-11-18 DIAGNOSIS — Z3200 Encounter for pregnancy test, result unknown: Secondary | ICD-10-CM | POA: Diagnosis not present

## 2019-11-19 DIAGNOSIS — Z3200 Encounter for pregnancy test, result unknown: Secondary | ICD-10-CM | POA: Diagnosis not present

## 2019-12-03 DIAGNOSIS — O0900 Supervision of pregnancy with history of infertility, unspecified trimester: Secondary | ICD-10-CM | POA: Diagnosis not present

## 2019-12-07 ENCOUNTER — Emergency Department (HOSPITAL_COMMUNITY): Payer: BC Managed Care – PPO

## 2019-12-07 ENCOUNTER — Other Ambulatory Visit: Payer: Self-pay

## 2019-12-07 ENCOUNTER — Encounter (HOSPITAL_COMMUNITY): Payer: Self-pay | Admitting: *Deleted

## 2019-12-07 ENCOUNTER — Emergency Department (HOSPITAL_COMMUNITY)
Admission: EM | Admit: 2019-12-07 | Discharge: 2019-12-07 | Disposition: A | Discharge status: Home / Self Care | Payer: BC Managed Care – PPO | Attending: Emergency Medicine | Admitting: Emergency Medicine

## 2019-12-07 DIAGNOSIS — Y9389 Activity, other specified: Secondary | ICD-10-CM | POA: Diagnosis not present

## 2019-12-07 DIAGNOSIS — Y998 Other external cause status: Secondary | ICD-10-CM | POA: Insufficient documentation

## 2019-12-07 DIAGNOSIS — S61255A Open bite of left ring finger without damage to nail, initial encounter: Secondary | ICD-10-CM | POA: Insufficient documentation

## 2019-12-07 DIAGNOSIS — Z23 Encounter for immunization: Secondary | ICD-10-CM | POA: Insufficient documentation

## 2019-12-07 DIAGNOSIS — W540XXA Bitten by dog, initial encounter: Secondary | ICD-10-CM | POA: Diagnosis not present

## 2019-12-07 DIAGNOSIS — S61250A Open bite of right index finger without damage to nail, initial encounter: Secondary | ICD-10-CM | POA: Insufficient documentation

## 2019-12-07 DIAGNOSIS — Y9289 Other specified places as the place of occurrence of the external cause: Secondary | ICD-10-CM | POA: Diagnosis not present

## 2019-12-07 DIAGNOSIS — S61230A Puncture wound without foreign body of right index finger without damage to nail, initial encounter: Secondary | ICD-10-CM | POA: Diagnosis not present

## 2019-12-07 DIAGNOSIS — S61235A Puncture wound without foreign body of left ring finger without damage to nail, initial encounter: Secondary | ICD-10-CM | POA: Diagnosis not present

## 2019-12-07 MED ORDER — DOUBLE ANTIBIOTIC 500-10000 UNIT/GM EX OINT: TOPICAL_OINTMENT | Freq: Once | CUTANEOUS | Status: DC

## 2019-12-07 MED ORDER — AMOXICILLIN-POT CLAVULANATE 875-125 MG PO TABS: 1.0000 | ORAL_TABLET | Freq: Two times a day (BID) | ORAL | 0 refills | Status: DC

## 2019-12-07 MED ORDER — TETANUS-DIPHTH-ACELL PERTUSSIS 5-2.5-18.5 LF-MCG/0.5 IM SUSP
0.5000 mL | Freq: Once | INTRAMUSCULAR | Status: AC
  Administered 2019-12-07: 0.5 mL via INTRAMUSCULAR
  Filled 2019-12-07: qty 0.5

## 2019-12-07 NOTE — ED Triage Notes (Signed)
Pt states that she was trying to break up dogs fighting when she got bitten on right index finger and left ring finger.

## 2019-12-07 NOTE — Discharge Instructions (Addendum)
Please read the attachment on animal bites.  Your tetanus has been updated here in the ED.  Please keep the wounds clean and covered.  I have prescribed you Augmentin.  If you develop any spreading redness, swelling, warmth, fevers, or other evidence of infection, it is important that you take the Augmentin antibiotics as directed.  Return to the ED or seek immediate medical attention should you experience any new or worsening symptoms.

## 2019-12-07 NOTE — ED Provider Notes (Signed)
[facility] EMERGENCY DEPARTMENT Provider Note   CSN: [id number] Arrival date & time: 12/07/19  1547     History Chief Complaint  Patient presents with  . Animal Bite    April Charles is a 29 y.o. female with no relevant past medical history presents the ED accompanied by her husband with complaints of dog bite.  She reports that her and her husband both were bitten by a pit bull while attempting to break up a fight between dogs.  She sustained minor puncture wounds to her right index and left ring finger, both wounds located near DIP.  She reports that they are nonbleeding and is not requesting pain medications at this time.  She states that she is currently [redacted] weeks gestation.  She denies any numbness, weakness, anticoagulation, bleeding disorders, or other symptoms.  Patient reports that she has not had the tetanus Boostrix injection since she was in high school.   HPI     Past Medical History:  Diagnosis Date  . Heart murmur    Hx as a child, no problems as an adult  . IBS (irritable bowel syndrome)    no meds    Patient Active Problem List   Diagnosis Date Noted  . Ectopic pregnancy without intrauterine pregnancy 12/10/2015  . Hx of unilateral salpingectomy 12/10/2015    Past Surgical History:  Procedure Laterality Date  . LAPAROSCOPY N/A 12/10/2015   Procedure: Operative Laparoscopy, Left Salpingectomy;  Surgeon: Edwinna Areola, DO;  Location: [facility];  Service: Gynecology;  Laterality: N/A;     OB History   No obstetric history on file.     No family history on file.  Social History   Tobacco Use  . Smoking status: Never Smoker  . Smokeless tobacco: Never Used  Substance Use Topics  . Alcohol use: No  . Drug use: No    Home Medications Prior to Admission medications   Medication Sig Start Date End Date Taking? Authorizing Provider  amoxicillin-clavulanate (AUGMENTIN) 875-125 MG tablet Take 1 tablet by mouth every 12 (twelve) hours. 12/07/19    Lorelee New, PA-C  ibuprofen (ADVIL,MOTRIN) 200 MG tablet Take 200 mg by mouth every 6 (six) hours as needed for mild pain.    [provider]  oxyCODONE-acetaminophen (PERCOCET) 5-325 MG tablet Take 1 tablet every 4 hours or 2 tablets every 6 hours as needed for severe pain. 07/20/17   Arthor Captain, PA-C  Prenatal Vit-Fe Fumarate-FA (PRENATAL MULTIVITAMIN) TABS tablet Take 1 tablet by mouth daily at 12 noon.    [provider]  promethazine (PHENERGAN) 25 MG tablet Take 1 tablet (25 mg total) by mouth every 6 (six) hours as needed for nausea or vomiting. 07/20/17   Arthor Captain, PA-C  tamsulosin (FLOMAX) 0.4 MG CAPS capsule Take 1 capsule (0.4 mg total) by mouth 2 (two) times daily. 07/20/17   Arthor Captain, PA-C    Allergies    Patient has no known allergies.  Review of Systems   Review of Systems  Constitutional: Negative for fever.  Musculoskeletal: Negative for arthralgias and joint swelling.  Skin: Positive for wound. Negative for color change.  Neurological: Negative for weakness and numbness.    Physical Exam Updated Vital Signs BP 124/80 (BP Location: Right Arm)   Pulse 92   Temp 98.6 F (37 C) (Oral)   Resp 16   Ht 5' (1.524 m)   Wt 59 kg   SpO2 99%   BMI 25.39 kg/m   Physical Exam Vitals  and nursing note reviewed. Exam conducted with a chaperone present.  Constitutional:      General: She is not in acute distress.    Appearance: Normal appearance. She is not ill-appearing.  HENT:     Head: Normocephalic and atraumatic.  Eyes:     General: No scleral icterus.    Conjunctiva/sclera: Conjunctivae normal.  Cardiovascular:     Rate and Rhythm: Normal rate and regular rhythm.     Pulses: Normal pulses.     Heart sounds: Normal heart sounds.  Pulmonary:     Effort: Pulmonary effort is normal. No respiratory distress.  Musculoskeletal:     Comments: Right hand: Small puncture wound near DIP on index finger.  ROM and strength intact.   Capillary refill intact.  Radial pulse intact.  Sensation intact throughout.  Hemostasis achieved.  No obvious foreign bodies. Left hand: Small puncture wound near DIP on fourth finger.  ROM and strength intact.  Capillary refill intact.  Radial pulse intact.  Sensation intact throughout.  Hemostasis achieved.  No obvious foreign bodies.  Skin:    General: Skin is dry.  Neurological:     Mental Status: She is alert.     GCS: GCS eye subscore is 4. GCS verbal subscore is 5. GCS motor subscore is 6.  Psychiatric:        Mood and Affect: Mood normal.        Behavior: Behavior normal.        Thought Content: Thought content normal.     ED Results / Procedures / Treatments   Labs (all labs ordered are listed, but only abnormal results are displayed) Labs Reviewed - No data to display  EKG None  Radiology DG Finger Index Right  Result Date: 12/07/2019 CLINICAL DATA:  Status post dog bite with puncture wounds to the right index finger. Initial encounter. EXAM: RIGHT INDEX FINGER 2+V COMPARISON:  None. FINDINGS: There is no evidence of fracture or dislocation. There is no evidence of arthropathy or other focal bone abnormality. Soft tissues are unremarkable. IMPRESSION: Negative exam. Electronically Signed   By: Drusilla Kanner M.D.   On: 12/07/2019 17:26   DG Finger Ring Left  Result Date: 12/07/2019 CLINICAL DATA:  Status post dog bite with puncture wounds of the left ring finger. Initial encounter. EXAM: LEFT RING FINGER 2+V COMPARISON:  None. FINDINGS: There is no evidence of fracture or dislocation. There is no evidence of arthropathy or other focal bone abnormality. Soft tissues are unremarkable. IMPRESSION: Negative exam. Electronically Signed   By: Drusilla Kanner M.D.   On: 12/07/2019 17:26    Procedures Procedures (including critical care time)  Medications Ordered in ED Medications  Tdap (BOOSTRIX) injection 0.5 mL (0.5 mLs Intramuscular Given 12/07/19 1734)    ED Course   I have reviewed the triage vital signs and the nursing notes.  Pertinent labs & imaging results that were available during my care of the patient were reviewed by me and considered in my medical decision making (see chart for details).    MDM Rules/Calculators/A&P                          Patient presents the ED with 2 small puncture wounds subsequent to dog bite.  Patient is [redacted] weeks gestation.  She states that her last Boostrix injection was when she was in high school.  We will update patient's tetanus here in the ED.  Will obtain imaging given the proximity  of injuries to DIP joints to evaluate for osseous injury or foreign body.  Will irrigate the wounds copiously here in the ED.   We will prescribe patient a course of Augmentin as penicillins are typically well-tolerated in pregnancy.  However, I am encouraging patient to hold off on initiation of antibiotics unless she begins to see evidence of infection.  Discussed with her that I would prefer to keep the area clean and covered and monitor for symptoms/signs of infection.  Patient and husband are both agreeable.  Patient to follow-up with her PCP/OB/GYN for ongoing evaluation management.  All of the evaluation and work-up results were discussed with the patient and any family at bedside.  Patient and/or family were informed that while patient is appropriate for discharge at this time, some medical emergencies may only develop or become detectable after a period of time.  I specifically instructed patient and/or family to return to return to the ED or seek immediate medical attention for any new or worsening symptoms.  They were provided opportunity to ask any additional questions and have none at this time.  Prior to discharge patient is feeling well, agreeable with plan for discharge home.  They have expressed understanding of verbal discharge instructions as well as return precautions and are agreeable to the plan.    Final Clinical  Impression(s) / ED Diagnoses Final diagnoses:  Dog bite, initial encounter    Rx / DC Orders ED Discharge Orders         Ordered    amoxicillin-clavulanate (AUGMENTIN) 875-125 MG tablet  Every 12 hours        12/07/19 1740           Elvera Maria 12/07/19 1740    Bethann Berkshire, MD 12/08/19 1015

## 2020-01-07 DIAGNOSIS — O26891 Other specified pregnancy related conditions, first trimester: Secondary | ICD-10-CM | POA: Diagnosis not present

## 2020-01-07 DIAGNOSIS — Z369 Encounter for antenatal screening, unspecified: Secondary | ICD-10-CM | POA: Diagnosis not present

## 2020-01-07 DIAGNOSIS — Z368A Encounter for antenatal screening for other genetic defects: Secondary | ICD-10-CM | POA: Diagnosis not present

## 2020-01-07 DIAGNOSIS — Z1151 Encounter for screening for human papillomavirus (HPV): Secondary | ICD-10-CM | POA: Diagnosis not present

## 2020-01-07 DIAGNOSIS — Z3481 Encounter for supervision of other normal pregnancy, first trimester: Secondary | ICD-10-CM | POA: Diagnosis not present

## 2020-01-07 DIAGNOSIS — Z113 Encounter for screening for infections with a predominantly sexual mode of transmission: Secondary | ICD-10-CM | POA: Diagnosis not present

## 2020-01-07 DIAGNOSIS — Z3A12 12 weeks gestation of pregnancy: Secondary | ICD-10-CM | POA: Diagnosis not present

## 2020-01-07 DIAGNOSIS — R87610 Atypical squamous cells of undetermined significance on cytologic smear of cervix (ASC-US): Secondary | ICD-10-CM | POA: Diagnosis not present

## 2020-02-04 DIAGNOSIS — R87612 Low grade squamous intraepithelial lesion on cytologic smear of cervix (LGSIL): Secondary | ICD-10-CM | POA: Diagnosis not present

## 2020-02-04 DIAGNOSIS — R87619 Unspecified abnormal cytological findings in specimens from cervix uteri: Secondary | ICD-10-CM | POA: Diagnosis not present

## 2020-02-25 ENCOUNTER — Inpatient Hospital Stay (HOSPITAL_COMMUNITY): Payer: BC Managed Care – PPO

## 2020-02-25 ENCOUNTER — Other Ambulatory Visit: Payer: Self-pay

## 2020-02-25 ENCOUNTER — Inpatient Hospital Stay (HOSPITAL_COMMUNITY)
Admission: AD | Admit: 2020-02-25 | Discharge: 2020-02-25 | Disposition: A | Discharge status: Home / Self Care | Payer: BC Managed Care – PPO | Attending: Obstetrics and Gynecology | Admitting: Obstetrics and Gynecology

## 2020-02-25 ENCOUNTER — Encounter (HOSPITAL_COMMUNITY): Payer: Self-pay | Admitting: Obstetrics and Gynecology

## 2020-02-25 DIAGNOSIS — N200 Calculus of kidney: Secondary | ICD-10-CM | POA: Diagnosis not present

## 2020-02-25 DIAGNOSIS — O99891 Other specified diseases and conditions complicating pregnancy: Secondary | ICD-10-CM | POA: Insufficient documentation

## 2020-02-25 DIAGNOSIS — Z3A19 19 weeks gestation of pregnancy: Secondary | ICD-10-CM | POA: Diagnosis not present

## 2020-02-25 DIAGNOSIS — N132 Hydronephrosis with renal and ureteral calculous obstruction: Secondary | ICD-10-CM | POA: Diagnosis not present

## 2020-02-25 DIAGNOSIS — R109 Unspecified abdominal pain: Secondary | ICD-10-CM

## 2020-02-25 DIAGNOSIS — O26832 Pregnancy related renal disease, second trimester: Secondary | ICD-10-CM

## 2020-02-25 DIAGNOSIS — Z3482 Encounter for supervision of other normal pregnancy, second trimester: Secondary | ICD-10-CM | POA: Diagnosis not present

## 2020-02-25 HISTORY — DX: Calculus of kidney: N20.0

## 2020-02-25 LAB — COMPREHENSIVE METABOLIC PANEL
ALT: 23 U/L (ref 0–44)
AST: 29 U/L (ref 15–41)
Albumin: 3.3 g/dL — ABNORMAL LOW (ref 3.5–5.0)
Alkaline Phosphatase: 53 U/L (ref 38–126)
Anion gap: 12 (ref 5–15)
BUN: 5 mg/dL — ABNORMAL LOW (ref 6–20)
CO2: 21 mmol/L — ABNORMAL LOW (ref 22–32)
Calcium: 9.6 mg/dL (ref 8.9–10.3)
Chloride: 103 mmol/L (ref 98–111)
Creatinine, Ser: 0.86 mg/dL (ref 0.44–1.00)
GFR, Estimated: >60 mL/min (ref >60)
Glucose, Bld: 118 mg/dL — ABNORMAL HIGH (ref 70–99)
Potassium: 4 mmol/L (ref 3.5–5.1)
Sodium: 136 mmol/L (ref 135–145)
Total Bilirubin: 0.7 mg/dL (ref 0.3–1.2)
Total Protein: 6.3 g/dL — ABNORMAL LOW (ref 6.5–8.1)

## 2020-02-25 LAB — URINALYSIS, ROUTINE W REFLEX MICROSCOPIC
Bilirubin Urine: NEGATIVE
Glucose, UA: NEGATIVE mg/dL
Ketones, ur: 80 mg/dL — AB
Leukocytes,Ua: NEGATIVE
Nitrite: NEGATIVE
Protein, ur: NEGATIVE mg/dL
RBC / HPF: >50 RBC/hpf — ABNORMAL HIGH (ref 0–5)
Specific Gravity, Urine: 1.015 (ref 1.005–1.030)
pH: 8 (ref 5.0–8.0)

## 2020-02-25 LAB — CBC WITH DIFFERENTIAL/PLATELET
Abs Immature Granulocytes: 0.15 10*3/uL — ABNORMAL HIGH (ref 0.00–0.07)
Basophils Absolute: 0.1 10*3/uL (ref 0.0–0.1)
Basophils Relative: 0 %
Eosinophils Absolute: 0 10*3/uL (ref 0.0–0.5)
Eosinophils Relative: 0 %
HCT: 32.9 % — ABNORMAL LOW (ref 36.0–46.0)
Hemoglobin: 11.3 g/dL — ABNORMAL LOW (ref 12.0–15.0)
Immature Granulocytes: 1 %
Lymphocytes Relative: 9 %
Lymphs Abs: 1.5 10*3/uL (ref 0.7–4.0)
MCH: 29.8 pg (ref 26.0–34.0)
MCHC: 34.3 g/dL (ref 30.0–36.0)
MCV: 86.8 fL (ref 80.0–100.0)
Monocytes Absolute: 0.7 10*3/uL (ref 0.1–1.0)
Monocytes Relative: 4 %
Neutro Abs: 13.7 10*3/uL — ABNORMAL HIGH (ref 1.7–7.7)
Neutrophils Relative %: 86 %
Platelets: 260 10*3/uL (ref 150–400)
RBC: 3.79 MIL/uL — ABNORMAL LOW (ref 3.87–5.11)
RDW: 13.4 % (ref 11.5–15.5)
WBC: 16.2 10*3/uL — ABNORMAL HIGH (ref 4.0–10.5)
nRBC: 0 % (ref 0.0–0.2)

## 2020-02-25 MED ORDER — ONDANSETRON HCL 4 MG/2ML IJ SOLN
4.0000 mg | Freq: Once | INTRAMUSCULAR | Status: AC
  Administered 2020-02-25: 4 mg via INTRAVENOUS
  Filled 2020-02-25: qty 2

## 2020-02-25 MED ORDER — TAMSULOSIN HCL 0.4 MG PO CAPS: 0.4000 mg | ORAL_CAPSULE | Freq: Every day | ORAL | 0 refills | Status: DC

## 2020-02-25 MED ORDER — LACTATED RINGERS IV BOLUS
1000.0000 mL | Freq: Once | INTRAVENOUS | Status: AC
  Administered 2020-02-25: 1000 mL via INTRAVENOUS

## 2020-02-25 MED ORDER — OXYCODONE-ACETAMINOPHEN 5-325 MG PO TABS
1.0000 | ORAL_TABLET | Freq: Four times a day (QID) | ORAL | 0 refills | Status: DC | PRN
Start: 2020-02-25 — End: 2020-06-03

## 2020-02-25 MED ORDER — LACTATED RINGERS IV SOLN
Freq: Once | INTRAVENOUS | Status: AC
  Filled 2020-02-25: qty 10

## 2020-02-25 MED ORDER — ONDANSETRON 4 MG PO TBDP: 4.0000 mg | ORAL_TABLET | Freq: Three times a day (TID) | ORAL | 0 refills | Status: DC | PRN

## 2020-02-25 MED ORDER — HYDROMORPHONE HCL 1 MG/ML IJ SOLN
0.5000 mg | Freq: Once | INTRAMUSCULAR | Status: AC
  Administered 2020-02-25: 0.5 mg via INTRAVENOUS
  Filled 2020-02-25: qty 1

## 2020-02-25 MED ORDER — OXYCODONE-ACETAMINOPHEN 5-325 MG PO TABS: 1.0000 | ORAL_TABLET | Freq: Four times a day (QID) | ORAL | 0 refills | Status: DC | PRN

## 2020-02-25 MED ORDER — TAMSULOSIN HCL 0.4 MG PO CAPS
0.4000 mg | ORAL_CAPSULE | Freq: Every day | ORAL | 0 refills | Status: DC
Start: 2020-02-25 — End: 2020-02-25

## 2020-02-25 NOTE — MAU Note (Signed)
Pt has history of kidney stones on right , today she started having that same type of pain but on the left.

## 2020-02-25 NOTE — MAU Provider Note (Signed)
History     CSN: [id number]  Arrival date and time: 02/25/20 9892   First Provider Initiated Contact with Patient 02/25/20 0304      Chief Complaint  Patient presents with  . Flank Pain   April Charles is a 29 y.o. G2P0 at [redacted]w[redacted]d who presents to MAU with complaints of flank pain. She reports that the pain started occurring yesterday, describes the pain as a dull ache then sudden sharp pain in back that radiates around to left flank. Rates pain 2/10 currently - denies taking any medication for pain. Patient reports intense sharp pain happening in waves, when pain increases patient reports that it causes her to have emesis. Reports 3 occurrences of emesis since 4pm yesterday. She reports hx of kidney stones prior to pregnancy and reports that this feel like she has another one. She denies lower abdominal pain, vaginal bleeding, discharge or urinary symptoms.    OB History    Gravida  2   Para      Term      Preterm      AB  1   Living        SAB      TAB      Ectopic  1   Multiple      Live Births              Past Medical History:  Diagnosis Date  . Heart murmur    Hx as a child, no problems as an adult  . IBS (irritable bowel syndrome)    no meds  . Kidney stone     Past Surgical History:  Procedure Laterality Date  . LAPAROSCOPY N/A 12/10/2015   Procedure: Operative Laparoscopy, Left Salpingectomy;  Surgeon: Edwinna Areola, DO;  Location: [facility];  Service: Gynecology;  Laterality: N/A;    No family history on file.  Social History   Tobacco Use  . Smoking status: Never Smoker  . Smokeless tobacco: Never Used  Substance Use Topics  . Alcohol use: No  . Drug use: No    Allergies: No Known Allergies  Medications Prior to Admission  Medication Sig Dispense Refill Last Dose  . Prenatal Vit-Fe Fumarate-FA (PRENATAL MULTIVITAMIN) TABS tablet Take 1 tablet by mouth daily at 12 noon.   02/25/2020 at Unknown time  . amoxicillin-clavulanate  (AUGMENTIN) 875-125 MG tablet Take 1 tablet by mouth every 12 (twelve) hours. 14 tablet 0   . ibuprofen (ADVIL,MOTRIN) 200 MG tablet Take 200 mg by mouth every 6 (six) hours as needed for mild pain.     Marland Kitchen oxyCODONE-acetaminophen (PERCOCET) 5-325 MG tablet Take 1 tablet every 4 hours or 2 tablets every 6 hours as needed for severe pain. 20 tablet 0   . promethazine (PHENERGAN) 25 MG tablet Take 1 tablet (25 mg total) by mouth every 6 (six) hours as needed for nausea or vomiting. 12 tablet 0   . tamsulosin (FLOMAX) 0.4 MG CAPS capsule Take 1 capsule (0.4 mg total) by mouth 2 (two) times daily. 10 capsule 0     Review of Systems  Constitutional: Negative.   Respiratory: Negative.   Cardiovascular: Negative.   Gastrointestinal: Positive for nausea and vomiting. Negative for constipation and diarrhea.  Genitourinary: Positive for flank pain. Negative for difficulty urinating, dysuria, pelvic pain, urgency, vaginal bleeding and vaginal discharge.  Neurological: Negative.   Psychiatric/Behavioral: Negative.    Physical Exam   Blood pressure 130/78, pulse (!) 112, temperature 98.7 F (37.1 C), temperature source Oral,  resp. rate 17, height 5' (1.524 m), weight 63 kg, SpO2 98 %.  Physical Exam HENT:     Head: Normocephalic.  Cardiovascular:     Rate and Rhythm: Normal rate and regular rhythm.  Pulmonary:     Effort: Pulmonary effort is normal. No respiratory distress.     Breath sounds: Normal breath sounds. No wheezing.  Abdominal:     Palpations: There is no mass.     Tenderness: There is left CVA tenderness. There is no right CVA tenderness or guarding.     Comments: Gravid appropriate for gestational age.   Musculoskeletal:     Right lower leg: No edema.     Left lower leg: No edema.  Skin:    General: Skin is warm and dry.     Coloration: Skin is not pale.  Neurological:     Mental Status: She is alert and oriented to person, place, and time.  Psychiatric:        Mood and  Affect: Mood normal.        Behavior: Behavior normal.        Thought Content: Thought content normal.    FHR 147 by doppler   MAU Course  Procedures  MDM Orders Placed This Encounter  Procedures  . US RENAL  . Urinalysis, Routine w reflex microscopic Urine, Clean Catch  . CBC with Differential/Platelet  . Comprehensive metabolic panel  . Insert peripheral IV   Meds ordered this encounter  Medications  . lactated ringers bolus 1,000 mL  . ondansetron (ZOFRAN) injection 4 mg  . multivitamins adult (INFUVITE ADULT) 10 mL in lactated ringers 1,000 mL infusion   UA- noted 80 ketones and hematuria  Results for orders placed or performed during the hospital encounter of 02/25/20 (from the past 24 hour(s))  Urinalysis, Routine w reflex microscopic Urine, Clean Catch     Status: Abnormal   Collection Time: 02/25/20  2:24 AM  Result Value Ref Range   Color, Urine YELLOW YELLOW   APPearance CLOUDY (A) CLEAR   Specific Gravity, Urine 1.015 1.005 - 1.030   pH 8.0 5.0 - 8.0   Glucose, UA NEGATIVE NEGATIVE mg/dL   Hgb urine dipstick MODERATE (A) NEGATIVE   Bilirubin Urine NEGATIVE NEGATIVE   Ketones, ur 80 (A) NEGATIVE mg/dL   Protein, ur NEGATIVE NEGATIVE mg/dL   Nitrite NEGATIVE NEGATIVE   Leukocytes,Ua NEGATIVE NEGATIVE   RBC / HPF >50 (H) 0 - 5 RBC/hpf   WBC, UA 6-10 0 - 5 WBC/hpf   Bacteria, UA RARE (A) NONE SEEN   Squamous Epithelial / LPF 6-10 0 - 5   Amorphous Crystal PRESENT    Treatments in MAU included IV LR bolus, zofran and MVI  Renal US ordered to r/o kidney stone, CMP and CMET pending @0318  US sonographer called while patient is in US - patient requesting medication due to pain during US - dilaudid 0.5mg  ordered   Labs and US results reviewed:  Results for orders placed or performed during the hospital encounter of 02/25/20 (from the past 24 hour(s))  Urinalysis, Routine w reflex microscopic Urine, Clean Catch     Status: Abnormal   Collection Time: 02/25/20   2:24 AM  Result Value Ref Range   Color, Urine YELLOW YELLOW   APPearance CLOUDY (A) CLEAR   Specific Gravity, Urine 1.015 1.005 - 1.030   pH 8.0 5.0 - 8.0   Glucose, UA NEGATIVE NEGATIVE mg/dL   Hgb urine dipstick MODERATE (A) NEGATIVE  Bilirubin Urine NEGATIVE NEGATIVE   Ketones, ur 80 (A) NEGATIVE mg/dL   Protein, ur NEGATIVE NEGATIVE mg/dL   Nitrite NEGATIVE NEGATIVE   Leukocytes,Ua NEGATIVE NEGATIVE   RBC / HPF >50 (H) 0 - 5 RBC/hpf   WBC, UA 6-10 0 - 5 WBC/hpf   Bacteria, UA RARE (A) NONE SEEN   Squamous Epithelial / LPF 6-10 0 - 5   Amorphous Crystal PRESENT   CBC with Differential/Platelet     Status: Abnormal   Collection Time: 02/25/20  3:35 AM  Result Value Ref Range   WBC 16.2 (H) 4.0 - 10.5 K/uL   RBC 3.79 (L) 3.87 - 5.11 MIL/uL   Hemoglobin 11.3 (L) 12.0 - 15.0 g/dL   HCT 27.0 (L) 36 - 46 %   MCV 86.8 80.0 - 100.0 fL   MCH 29.8 26.0 - 34.0 pg   MCHC 34.3 30.0 - 36.0 g/dL   RDW 62.3 76.2 - 83.1 %   Platelets 260 150 - 400 K/uL   nRBC 0.0 0.0 - 0.2 %   Neutrophils Relative % 86 %   Neutro Abs 13.7 (H) 1.7 - 7.7 K/uL   Lymphocytes Relative 9 %   Lymphs Abs 1.5 0.7 - 4.0 K/uL   Monocytes Relative 4 %   Monocytes Absolute 0.7 0.1 - 1.0 K/uL   Eosinophils Relative 0 %   Eosinophils Absolute 0.0 0.0 - 0.5 K/uL   Basophils Relative 0 %   Basophils Absolute 0.1 0.0 - 0.1 K/uL   Immature Granulocytes 1 %   Abs Immature Granulocytes 0.15 (H) 0.00 - 0.07 K/uL  Comprehensive metabolic panel     Status: Abnormal   Collection Time: 02/25/20  3:35 AM  Result Value Ref Range   Sodium 136 135 - 145 mmol/L   Potassium 4.0 3.5 - 5.1 mmol/L   Chloride 103 98 - 111 mmol/L   CO2 21 (L) 22 - 32 mmol/L   Glucose, Bld 118 (H) 70 - 99 mg/dL   BUN 5 (L) 6 - 20 mg/dL   Creatinine, Ser 5.17 0.44 - 1.00 mg/dL   Calcium 9.6 8.9 - 61.6 mg/dL   Total Protein 6.3 (L) 6.5 - 8.1 g/dL   Albumin 3.3 (L) 3.5 - 5.0 g/dL   AST 29 15 - 41 U/L   ALT 23 0 - 44 U/L   Alkaline Phosphatase  53 38 - 126 U/L   Total Bilirubin 0.7 0.3 - 1.2 mg/dL   GFR, Estimated >07 >37 mL/min   Anion gap 12 5 - 15   US RENAL  Result Date: 02/25/2020 CLINICAL DATA:  Left-sided flank pain EXAM: RENAL / URINARY TRACT ULTRASOUND COMPLETE COMPARISON:  07/20/2017 FINDINGS: Right Kidney: Renal measurements: 11.2 x 5.6 x 4.9 cm = volume: 160 mL. Mild pelviectasis. There is a 4 mm interpolar calculus appearance with ring down artifact. Left Kidney: Renal measurements: 13 x 6 x 4.5 cm = volume: 200 mL. Moderate hydronephrosis. No visible obstructing process or mass/collection. Bladder: Empty. Cannot assess for ureteral jets. Other: None. IMPRESSION: 1. Moderate left hydronephrosis. The bladder is empty and ureteral jets cannot be assessed. 2. 4 mm right renal calculus. Electronically Signed   By: Marnee Spring M.D.   On: 02/25/2020 04:15   Discussed with patient presence of kidney stone on right and moderate hydronephrosis on left.  Reassessment after pain medication - patient reports that pain is now 7/10 but starting to increase again. Additional dose of 0.5mg  dilaudid given to pain.   Rx for  flomax, zofran and percocet sent to pharmacy of choice. Patient given strainer and discussed importance of increasing water consumption. Discussed with patient that urology would need to be consulted if patient has not passed stone and pain is resolved in about 2 weeks, patient verbalizes understanding.   Discussed reasons to return to MAU. Follow up as scheduled in the office. Return to MAU as needed. Pt stable at time of discharge.  Assessment and Plan   1. Pregnancy complicated by nephrolithiasis in second trimester, antepartum   2. Acute left flank pain   3. Renal calculus or stone   4. [redacted] weeks gestation of pregnancy    Discharge home Follow up as scheduled in the office for prenatal care Return to MAU as needed for reasons discussed and/or emergencies  Rx for flomax, zofran and percocet    Follow-up  Information    [facility] Follow up.   Contact information: [address]  [address] [place] [address] [phone]                Sharyon Cable CNM 02/25/2020, 5:45 AM

## 2020-03-01 DIAGNOSIS — N201 Calculus of ureter: Secondary | ICD-10-CM | POA: Diagnosis not present

## 2020-03-01 DIAGNOSIS — M5489 Other dorsalgia: Secondary | ICD-10-CM | POA: Diagnosis not present

## 2020-03-02 DIAGNOSIS — R609 Edema, unspecified: Secondary | ICD-10-CM | POA: Diagnosis not present

## 2020-03-08 DIAGNOSIS — N202 Calculus of kidney with calculus of ureter: Secondary | ICD-10-CM | POA: Diagnosis not present

## 2020-03-18 DIAGNOSIS — Z3A22 22 weeks gestation of pregnancy: Secondary | ICD-10-CM | POA: Diagnosis not present

## 2020-03-18 DIAGNOSIS — Z362 Encounter for other antenatal screening follow-up: Secondary | ICD-10-CM | POA: Diagnosis not present

## 2020-04-02 DIAGNOSIS — Z3A24 24 weeks gestation of pregnancy: Secondary | ICD-10-CM | POA: Diagnosis not present

## 2020-04-02 DIAGNOSIS — Z362 Encounter for other antenatal screening follow-up: Secondary | ICD-10-CM | POA: Diagnosis not present

## 2020-04-02 DIAGNOSIS — N130 Hydronephrosis with ureteropelvic junction obstruction: Secondary | ICD-10-CM | POA: Diagnosis not present

## 2020-04-02 DIAGNOSIS — N201 Calculus of ureter: Secondary | ICD-10-CM | POA: Diagnosis not present

## 2020-05-04 DIAGNOSIS — Z3689 Encounter for other specified antenatal screening: Secondary | ICD-10-CM | POA: Diagnosis not present

## 2020-05-04 DIAGNOSIS — Z23 Encounter for immunization: Secondary | ICD-10-CM | POA: Diagnosis not present

## 2020-05-11 DIAGNOSIS — O99810 Abnormal glucose complicating pregnancy: Secondary | ICD-10-CM | POA: Diagnosis not present

## 2020-05-18 ENCOUNTER — Other Ambulatory Visit: Payer: Self-pay

## 2020-05-18 ENCOUNTER — Ambulatory Visit: Payer: BC Managed Care – PPO | Admitting: Registered"

## 2020-05-18 ENCOUNTER — Encounter: Payer: BC Managed Care – PPO | Attending: Obstetrics and Gynecology | Admitting: Registered"

## 2020-05-18 DIAGNOSIS — O24119 Pre-existing diabetes mellitus, type 2, in pregnancy, unspecified trimester: Secondary | ICD-10-CM | POA: Diagnosis not present

## 2020-05-18 DIAGNOSIS — O24419 Gestational diabetes mellitus in pregnancy, unspecified control: Secondary | ICD-10-CM | POA: Diagnosis not present

## 2020-05-18 NOTE — Progress Notes (Signed)
Patient was seen on 05/18/20; 85w1dfor Gestational Diabetes self-management. EDD 07/19/20; 370w1dPatient states no history of GDM. Diet history obtained. Patient eats variety of all food groups. Beverages include water.   The following learning objectives were met by the patient :   States the definition of Gestational Diabetes  States why dietary management is important in controlling blood glucose  Describes the effects of carbohydrates on blood glucose levels  Demonstrates ability to create a balanced meal plan  Demonstrates carbohydrate counting   States when to check blood glucose levels  Demonstrates proper blood glucose monitoring techniques  States the effect of stress and exercise on blood glucose levels  States the importance of limiting caffeine and abstaining from alcohol and smoking  Plan:  Aim for 3 Carbohydrate Choices per meal (45 grams) +/- 1 either way  Aim for 1-2 Carbohydrate Choices per snack Begin reading food labels for Total Carbohydrate of foods If OK with your MD, consider  increasing your activity level by walking, Arm Chair Exercises or other activity daily as tolerated Begin checking Blood Glucose before breakfast and 2 hours after first bite of breakfast, lunch and dinner as directed by MD  Bring Log Book/Sheet and meter to every medical appointment  Take medication if directed by MD  Blood glucose monitor given: Patient brought meter to visit CBG: 151 mg/dL  Patient instructed to monitor glucose levels: FBS: 60 - 95 mg/dl 2 hour: <120 mg/dl  Patient received the following handouts:  Nutrition Diabetes and Pregnancy  Carbohydrate Counting List  Blood glucose Log Sheet  Patient will be seen for follow-up as needed.

## 2020-05-20 ENCOUNTER — Other Ambulatory Visit: Payer: Self-pay

## 2020-05-21 NOTE — Progress Notes (Signed)
Patient was seen on 05/18/20; 28w1dfor Gestational Diabetes self-management. EDD 07/19/20; 32w1dPatient states no history of GDM. Diet history obtained. Patient eats variety of all food groups. Beverages include water.   The following learning objectives were met by the patient :   States the definition of Gestational Diabetes  States why dietary management is important in controlling blood glucose  Describes the effects of carbohydrates on blood glucose levels  Demonstrates ability to create a balanced meal plan  Demonstrates carbohydrate counting   States when to check blood glucose levels  Demonstrates proper blood glucose monitoring techniques  States the effect of stress and exercise on blood glucose levels  States the importance of limiting caffeine and abstaining from alcohol and smoking  Plan:  Aim for 3 Carbohydrate Choices per meal (45 grams) +/- 1 either way  Aim for 1-2 Carbohydrate Choices per snack Begin reading food labels for Total Carbohydrate of foods If OK with your MD, consider  increasing your activity level by walking, Arm Chair Exercises or other activity daily as tolerated Begin checking Blood Glucose before breakfast and 2 hours after first bite of breakfast, lunch and dinner as directed by MD  Bring Log Book/Sheet and meter to every medical appointment  Take medication if directed by MD  Blood glucose monitor given: Patient brought meter to visit CBG: 151 mg/dL  Patient instructed to monitor glucose levels: FBS: 60 - 95 mg/dl 2 hour: <120 mg/dl  Patient received the following handouts:  Nutrition Diabetes and Pregnancy  Carbohydrate Counting List  Blood glucose Log Sheet  Patient will be seen for follow-up as needed.

## 2020-05-23 ENCOUNTER — Inpatient Hospital Stay (HOSPITAL_COMMUNITY)
Admission: AD | Admit: 2020-05-23 | Discharge: 2020-05-23 | Disposition: A | Discharge status: Home / Self Care | Payer: BC Managed Care – PPO | Attending: Obstetrics and Gynecology | Admitting: Obstetrics and Gynecology

## 2020-05-23 ENCOUNTER — Encounter (HOSPITAL_COMMUNITY): Payer: Self-pay | Admitting: Obstetrics and Gynecology

## 2020-05-23 ENCOUNTER — Other Ambulatory Visit: Payer: Self-pay

## 2020-05-23 ENCOUNTER — Inpatient Hospital Stay (HOSPITAL_COMMUNITY): Payer: BC Managed Care – PPO

## 2020-05-23 DIAGNOSIS — O26893 Other specified pregnancy related conditions, third trimester: Secondary | ICD-10-CM | POA: Diagnosis not present

## 2020-05-23 DIAGNOSIS — R109 Unspecified abdominal pain: Secondary | ICD-10-CM

## 2020-05-23 DIAGNOSIS — Z3A31 31 weeks gestation of pregnancy: Secondary | ICD-10-CM | POA: Diagnosis not present

## 2020-05-23 DIAGNOSIS — O99891 Other specified diseases and conditions complicating pregnancy: Secondary | ICD-10-CM

## 2020-05-23 DIAGNOSIS — O26833 Pregnancy related renal disease, third trimester: Secondary | ICD-10-CM | POA: Insufficient documentation

## 2020-05-23 DIAGNOSIS — N200 Calculus of kidney: Secondary | ICD-10-CM | POA: Insufficient documentation

## 2020-05-23 DIAGNOSIS — R1031 Right lower quadrant pain: Secondary | ICD-10-CM | POA: Insufficient documentation

## 2020-05-23 LAB — URINALYSIS, ROUTINE W REFLEX MICROSCOPIC
Bilirubin Urine: NEGATIVE
Glucose, UA: NEGATIVE mg/dL
Ketones, ur: 20 mg/dL — AB
Nitrite: NEGATIVE
Protein, ur: NEGATIVE mg/dL
Specific Gravity, Urine: 1.012 (ref 1.005–1.030)
pH: 6 (ref 5.0–8.0)

## 2020-05-23 MED ORDER — TAMSULOSIN HCL 0.4 MG PO CAPS: 0.4000 mg | ORAL_CAPSULE | Freq: Every day | ORAL | 0 refills | Status: DC

## 2020-05-23 MED ORDER — CYCLOBENZAPRINE HCL 5 MG PO TABS
10.0000 mg | ORAL_TABLET | Freq: Once | ORAL | Status: AC
  Administered 2020-05-23: 10 mg via ORAL
  Filled 2020-05-23: qty 2

## 2020-05-23 MED ORDER — CYCLOBENZAPRINE HCL 10 MG PO TABS: 10.0000 mg | ORAL_TABLET | Freq: Three times a day (TID) | ORAL | 0 refills | Status: DC | PRN

## 2020-05-23 NOTE — MAU Provider Note (Signed)
History     CSN: [id number]  Arrival date and time: 05/23/20 0008   Event Date/Time   First Provider Initiated Contact with Patient 05/23/20 0114      Chief Complaint  Patient presents with  . Nephrolithiasis  . Back Pain   April Charles is a 30 y.o. G2P0 at [redacted]w[redacted]d who presents to MAU with complaints of right flank pain. Patient reports that the pain started occurring this past Wednesday. Patient rates the pain worsened tonight and she took a percocet around 2200 without relief of pain. Describes the pain as sharp stabbing pain in her back specific to the right side that radiates around her flank. Rates pain currently 3/10 but reports around 2200 pain was 10/10. She had kidney stone in November and patient reports that this pain feels similar. She denies cramping, contractions, vaginal bleeding or discharge. Patient reports +FM.    OB History    Gravida  2   Para      Term      Preterm      AB  1   Living        SAB      IAB      Ectopic  1   Multiple      Live Births              Past Medical History:  Diagnosis Date  . Heart murmur    Hx as a child, no problems as an adult  . IBS (irritable bowel syndrome)    no meds  . Kidney stone     Past Surgical History:  Procedure Laterality Date  . LAPAROSCOPY N/A 12/10/2015   Procedure: Operative Laparoscopy, Left Salpingectomy;  Surgeon: Edwinna Areola, DO;  Location: [facility];  Service: Gynecology;  Laterality: N/A;    History reviewed. No pertinent family history.  Social History   Tobacco Use  . Smoking status: Never Smoker  . Smokeless tobacco: Never Used  Substance Use Topics  . Alcohol use: No  . Drug use: No    Allergies: No Known Allergies  Medications Prior to Admission  Medication Sig Dispense Refill Last Dose  . oxyCODONE-acetaminophen (PERCOCET) 5-325 MG tablet Take 1 tablet by mouth every 6 (six) hours as needed for severe pain. 15 tablet 0 05/22/2020 at 2200  . Prenatal Vit-Fe  Fumarate-FA (PRENATAL MULTIVITAMIN) TABS tablet Take 1 tablet by mouth daily at 12 noon.   05/22/2020 at Unknown time  . ondansetron (ZOFRAN ODT) 4 MG disintegrating tablet Take 1 tablet (4 mg total) by mouth every 8 (eight) hours as needed for nausea or vomiting. 20 tablet 0   . promethazine (PHENERGAN) 25 MG tablet Take 1 tablet (25 mg total) by mouth every 6 (six) hours as needed for nausea or vomiting. 12 tablet 0   . tamsulosin (FLOMAX) 0.4 MG CAPS capsule Take 1 capsule (0.4 mg total) by mouth daily. 20 capsule 0     Review of Systems  Constitutional: Negative.   Respiratory: Negative.   Cardiovascular: Negative.   Gastrointestinal: Negative.   Genitourinary: Positive for flank pain. Negative for difficulty urinating, dysuria, frequency, pelvic pain, urgency, vaginal bleeding and vaginal discharge.  Musculoskeletal: Positive for back pain.  Neurological: Negative.   Psychiatric/Behavioral: Negative.    Physical Exam   Blood pressure 114/72, pulse 97, temperature 98.8 F (37.1 C), temperature source Oral, resp. rate 16, weight 68.3 kg, SpO2 97 %.  Physical Exam Vitals and nursing note reviewed.  Cardiovascular:  Rate and Rhythm: Normal rate and regular rhythm.  Pulmonary:     Effort: Pulmonary effort is normal. No respiratory distress.     Breath sounds: Normal breath sounds. No wheezing.  Abdominal:     Palpations: Abdomen is soft. There is no mass.     Tenderness: There is no abdominal tenderness. There is no right CVA tenderness, left CVA tenderness or guarding.     Comments: Gravid appropriate for gestational age  Musculoskeletal:     Right lower leg: No edema.     Left lower leg: No edema.  Skin:    General: Skin is warm and dry.  Neurological:     Mental Status: She is alert and oriented to person, place, and time.  Psychiatric:        Mood and Affect: Mood normal.        Behavior: Behavior normal.        Thought Content: Thought content normal.    Dilation:  Closed Effacement (%): Thick Cervical Position: Posterior Station: Ballotable Presentation: Undeterminable Exam by:: Lanice Shirts CNM  Fetal monitoring:  125/moderate/+accels/no decelerations  UI   MAU Course  Procedures  MDM Orders Placed This Encounter  Procedures  . US RENAL  . Urinalysis, Routine w reflex microscopic Urine, Clean Catch   Labs and [place] report reviewed: Results for orders placed or performed during the hospital encounter of 05/23/20 (from the past 24 hour(s))  Urinalysis, Routine w reflex microscopic Urine, Clean Catch     Status: Abnormal   Collection Time: 05/23/20  1:15 AM  Result Value Ref Range   Color, Urine YELLOW YELLOW   APPearance CLOUDY (A) CLEAR   Specific Gravity, Urine 1.012 1.005 - 1.030   pH 6.0 5.0 - 8.0   Glucose, UA NEGATIVE NEGATIVE mg/dL   Hgb urine dipstick SMALL (A) NEGATIVE   Bilirubin Urine NEGATIVE NEGATIVE   Ketones, ur 20 (A) NEGATIVE mg/dL   Protein, ur NEGATIVE NEGATIVE mg/dL   Nitrite NEGATIVE NEGATIVE   Leukocytes,Ua MODERATE (A) NEGATIVE   RBC / HPF 0-5 0 - 5 RBC/hpf   WBC, UA 6-10 0 - 5 WBC/hpf   Bacteria, UA RARE (A) NONE SEEN   Squamous Epithelial / LPF 11-20 0 - 5   Mucus PRESENT    US RENAL  Result Date: 05/23/2020 CLINICAL DATA:  Acute right flank pain EXAM: RENAL / URINARY TRACT ULTRASOUND COMPLETE COMPARISON:  04/02/2020 FINDINGS: Right Kidney: Renal measurements: 12.2 x 7.4 x 5.9 cm = volume: 275 mL. Moderate hydronephrosis. Renal stones are seen in the mid and lower poles measuring up to 5 mm. Left Kidney: Renal measurements: 12.8 x 5.8 x 4.5 cm = volume: 176 mL. 5 mm stone in the midpole. No hydronephrosis. Bladder: Appears normal for degree of bladder distention. Other: None. IMPRESSION: Moderate right hydronephrosis. Bilateral nephrolithiasis. Electronically Signed   By: Charlett Nose M.D.   On: 05/23/2020 02:18   Small Hgb in urine and presence of bilateral nephrolithiasis. Discussed with patient results of  ultrasound. Instructed patient to call [facility] and set of follow up appointment for recurrent nephrolithiasis- patient verbalizes understanding. Strainer given to patient for home use.   Patient currently rating pain 5/10 since ultrasound - Flexeril given in MAU prior to discharge home.  Rx for Flexeril and Flomax sent to pharmacy of choice. NST reactive and reassuring for gestational age and UI has resolved prior to discharge home.   Discussed reasons to return to MAU. Follow up as scheduled in the office  for prenatal care. Return to MAU as needed. Pt stable at time of discharge.   Assessment and Plan   1. Bilateral nephrolithiasis   2. Acute right flank pain   3. [redacted] weeks gestation of pregnancy    Discharge home Follow up as scheduled in the office for prenatal care Return to MAU as needed for reasons discussed and/or emergencies  Rx for Flomax and Flexeril sent to pharmacy of choice   Follow-up Information    [facility]. Schedule an appointment as soon as possible for a visit.   Why: Make appointment to be seen for recurrent kidney stones  Contact information: [address] [place] [address] [phone]       [facility] Follow up.   Why: Follow up scheduled for prenatal care  Contact information: [address]  [address] [place] [address] [phone]              Allergies as of 05/23/2020   No Known Allergies     Medication List    TAKE these medications   cyclobenzaprine 10 MG tablet Commonly known as: FLEXERIL Take 1 tablet (10 mg total) by mouth 3 (three) times daily as needed for muscle spasms.   ondansetron 4 MG disintegrating tablet Commonly known as: Zofran ODT Take 1 tablet (4 mg total) by mouth every 8 (eight) hours as needed for nausea or vomiting.   oxyCODONE-acetaminophen 5-325 MG tablet Commonly known as: Percocet Take 1 tablet by mouth every 6 (six) hours as needed for  severe pain.   prenatal multivitamin Tabs tablet Take 1 tablet by mouth daily at 12 noon.   promethazine 25 MG tablet Commonly known as: PHENERGAN Take 1 tablet (25 mg total) by mouth every 6 (six) hours as needed for nausea or vomiting.   tamsulosin 0.4 MG Caps capsule Commonly known as: FLOMAX Take 1 capsule (0.4 mg total) by mouth daily.       Sharyon Cable CNM 05/23/2020, 3:14 AM

## 2020-05-23 NOTE — MAU Note (Signed)
Pt presents to MAU due to pain that she has had since Wednesday.  Pt reports pain 3 on 0-10 pain scale.  Pt states that she has had kidney stones in the past, last time in November, and the pain that she is currently feeling is the same.  +FM

## 2020-05-24 ENCOUNTER — Encounter: Payer: Self-pay | Admitting: Urology

## 2020-05-24 DIAGNOSIS — N202 Calculus of kidney with calculus of ureter: Secondary | ICD-10-CM | POA: Diagnosis not present

## 2020-05-24 DIAGNOSIS — R8271 Bacteriuria: Secondary | ICD-10-CM | POA: Diagnosis not present

## 2020-05-24 DIAGNOSIS — N130 Hydronephrosis with ureteropelvic junction obstruction: Secondary | ICD-10-CM | POA: Diagnosis not present

## 2020-05-24 LAB — CULTURE, OB URINE

## 2020-06-03 ENCOUNTER — Encounter (HOSPITAL_COMMUNITY): Payer: Self-pay | Admitting: Obstetrics and Gynecology

## 2020-06-03 ENCOUNTER — Inpatient Hospital Stay (HOSPITAL_COMMUNITY)
Admission: AD | Admit: 2020-06-03 | Discharge: 2020-06-03 | Disposition: A | Discharge status: Home / Self Care | Payer: BC Managed Care – PPO | Attending: Obstetrics and Gynecology | Admitting: Obstetrics and Gynecology

## 2020-06-03 ENCOUNTER — Other Ambulatory Visit: Payer: Self-pay

## 2020-06-03 DIAGNOSIS — Z3689 Encounter for other specified antenatal screening: Secondary | ICD-10-CM | POA: Insufficient documentation

## 2020-06-03 NOTE — Discharge Instructions (Signed)
 Fetal Movement Counts Patient Name: ________________________________________________ Patient Due Date: ____________________  What is a fetal movement count? A fetal movement count is the number of times that you feel your baby move during a certain amount of time. This may also be called a fetal kick count. A fetal movement count is recommended for every pregnant woman. You may be asked to start counting fetal movements as early as week 28 of your pregnancy. Pay attention to when your baby is most active. You may notice your baby's sleep and wake cycles. You may also notice things that make your baby move more. You should do a fetal movement count:  When your baby is normally most active.  At the same time each day. A good time to count movements is while you are resting, after having something to eat and drink. How do I count fetal movements? 1. Find a quiet, comfortable area. Sit, or lie down on your side. 2. Write down the date, the start time and stop time, and the number of movements that you felt between those two times. Take this information with you to your health care visits. 3. Write down your start time when you feel the first movement. 4. Count kicks, flutters, swishes, rolls, and jabs. You should feel at least 10 movements. 5. You may stop counting after you have felt 10 movements, or if you have been counting for 2 hours. Write down the stop time. 6. If you do not feel 10 movements in 2 hours, contact your health care provider for further instructions. Your health care provider may want to do additional tests to assess your baby's well-being. Contact a health care provider if:  You feel fewer than 10 movements in 2 hours.  Your baby is not moving like he or she usually does. Date: ____________ Start time: ____________ Stop time: ____________ Movements: ____________ Date: ____________ Start time: ____________ Stop time: ____________ Movements: ____________ Date: ____________  Start time: ____________ Stop time: ____________ Movements: ____________ Date: ____________ Start time: ____________ Stop time: ____________ Movements: ____________ Date: ____________ Start time: ____________ Stop time: ____________ Movements: ____________ Date: ____________ Start time: ____________ Stop time: ____________ Movements: ____________ Date: ____________ Start time: ____________ Stop time: ____________ Movements: ____________ Date: ____________ Start time: ____________ Stop time: ____________ Movements: ____________ Date: ____________ Start time: ____________ Stop time: ____________ Movements: ____________ This information is not intended to replace advice given to you by your health care provider. Make sure you discuss any questions you have with your health care provider. Document Revised: 11/21/2018 Document Reviewed: 11/21/2018 Elsevier Patient Education  2021 Elsevier Inc.    Third Trimester of Pregnancy  The third trimester of pregnancy is from week 28 through week 40. This is also called months 7 through 9. This trimester is when your unborn baby (fetus) is growing very fast. At the end of the ninth month, the unborn baby is about 20 inches long. It weighs about 6-10 pounds. Body changes during your third trimester Your body continues to go through many changes during this time. The changes vary and generally return to normal after the baby is born. Physical changes  Your weight will continue to increase. You may gain 25-35 pounds (11-16 kg) by the end of the pregnancy. If you are underweight, you may gain 28-40 lb (about 13-18 kg). If you are overweight, you may gain 15-25 lb (about 7-11 kg).  You may start to get stretch marks on your hips, belly (abdomen), and breasts.  Your breasts will continue to   grow and may hurt. A yellow fluid (colostrum) may leak from your breasts. This is the first milk you are making for your baby.  You may have changes in your hair.  Your  belly button may stick out.  You may have more swelling in your hands, face, or ankles. Health changes  You may have heartburn.  You may have trouble pooping (constipation).  You may get hemorrhoids. These are swollen veins in the butt that can itch or get painful.  You may have swollen veins (varicose veins) in your legs.  You may have more body aches in the pelvis, back, or thighs.  You may have more tingling or numbness in your hands, arms, and legs. The skin on your belly may also feel numb.  You may feel short of breath as your womb (uterus) gets bigger. Other changes  You may pee (urinate) more often.  You may have more problems sleeping.  You may notice the unborn baby "dropping," or moving lower in your belly.  You may have more discharge coming from your vagina.  Your joints may feel loose, and you may have pain around your pelvic bone. Follow these instructions at home: Medicines  Take over-the-counter and prescription medicines only as told by your doctor. Some medicines are not safe during pregnancy.  Take a prenatal vitamin that contains at least 600 micrograms (mcg) of folic acid. Eating and drinking  Eat healthy meals that include: ? Fresh fruits and vegetables. ? Whole grains. ? Good sources of protein, such as meat, eggs, or tofu. ? Low-fat dairy products.  Avoid raw meat and unpasteurized juice, milk, and cheese. These carry germs that can harm you and your baby.  Eat 4 or 5 small meals rather than 3 large meals a day.  You may need to take these actions to prevent or treat trouble pooping: ? Drink enough fluids to keep your pee (urine) pale yellow. ? Eat foods that are high in fiber. These include beans, whole grains, and fresh fruits and vegetables. ? Limit foods that are high in fat and sugar. These include fried or sweet foods. Activity  Exercise only as told by your doctor. Stop exercising if you start to have cramps in your womb.  Avoid  heavy lifting.  Do not exercise if it is too hot or too humid, or if you are in a place of great height (high altitude).  If you choose to, you may have sex unless your doctor tells you not to. Relieving pain and discomfort  Take breaks often, and rest with your legs raised (elevated) if you have leg cramps or low back pain.  Take warm water baths (sitz baths) to soothe pain or discomfort caused by hemorrhoids. Use hemorrhoid cream if your doctor approves.  Wear a good support bra if your breasts are tender.  If you develop bulging, swollen veins in your legs: ? Wear support hose as told by your doctor. ? Raise your feet for 15 minutes, 3-4 times a day. ? Limit salt in your food. Safety  Talk to your doctor before traveling far distances.  Do not use hot tubs, steam rooms, or saunas.  Wear your seat belt at all times when you are in a car.  Talk with your doctor if someone is hurting you or yelling at you a lot. Preparing for your baby's arrival To prepare for the arrival of your baby:  Take prenatal classes.  Visit the hospital and tour the maternity area.  Buy   a rear-facing car seat. Learn how to install it in your car.  Prepare the baby's room. Take out all pillows and stuffed animals from the baby's crib. General instructions  Avoid cat litter boxes and soil used by cats. These carry germs that can cause harm to the baby and can cause a loss of your baby by miscarriage or stillbirth.  Do not douche or use tampons. Do not use scented sanitary pads.  Do not smoke or use any products that contain nicotine or tobacco. If you need help quitting, ask your doctor.  Do not drink alcohol.  Do not use herbal medicines, illegal drugs, or medicines that were not approved by your doctor. Chemicals in these products can affect your baby.  Keep all follow-up visits. This is important. Where to find more information  American Pregnancy Association:  americanpregnancy.org  American College of Obstetricians and Gynecologists: www.acog.org  Office on Women's Health: womenshealth.gov/pregnancy Contact a doctor if:  You have a fever.  You have mild cramps or pressure in your lower belly.  You have a nagging pain in your belly area.  You vomit, or you have watery poop (diarrhea).  You have bad-smelling fluid coming from your vagina.  You have pain when you pee, or your pee smells bad.  You have a headache that does not go away when you take medicine.  You have changes in how you see, or you see spots in front of your eyes. Get help right away if:  Your water breaks.  You have regular contractions that are less than 5 minutes apart.  You are spotting or bleeding from your vagina.  You have very bad belly cramps or pain.  You have trouble breathing.  You have chest pain.  You faint.  You have not felt the baby move for the amount of time told by your doctor.  You have new or increased pain, swelling, or redness in an arm or leg. Summary  The third trimester is from week 28 through week 40 (months 7 through 9). This is the time when your unborn baby is growing very fast.  During this time, your discomfort may increase as you gain weight and as your baby grows.  Get ready for your baby to arrive by taking prenatal classes, buying a rear-facing car seat, and preparing the baby's room.  Get help right away if you are bleeding from your vagina, you have chest pain and trouble breathing, or you have not felt the baby move for the amount of time told by your doctor. This information is not intended to replace advice given to you by your health care provider. Make sure you discuss any questions you have with your health care provider. Document Revised: 09/10/2019 Document Reviewed: 07/17/2019 Elsevier Patient Education  2021 Elsevier Inc.  

## 2020-06-03 NOTE — MAU Note (Signed)
Pt reports that she last felt the baby move on the way here, but it has not been as much or as strong since last night.  Denies vaginal bleeding or LOF.

## 2020-06-07 DIAGNOSIS — R8271 Bacteriuria: Secondary | ICD-10-CM | POA: Diagnosis not present

## 2020-06-07 DIAGNOSIS — N130 Hydronephrosis with ureteropelvic junction obstruction: Secondary | ICD-10-CM | POA: Diagnosis not present

## 2020-06-07 DIAGNOSIS — N201 Calculus of ureter: Secondary | ICD-10-CM | POA: Diagnosis not present

## 2020-06-12 ENCOUNTER — Other Ambulatory Visit: Payer: Self-pay

## 2020-06-12 ENCOUNTER — Inpatient Hospital Stay (HOSPITAL_COMMUNITY)
Admission: AD | Admit: 2020-06-12 | Discharge: 2020-06-12 | Disposition: A | Discharge status: Home / Self Care | Payer: BC Managed Care – PPO | Attending: Obstetrics and Gynecology | Admitting: Obstetrics and Gynecology

## 2020-06-12 ENCOUNTER — Encounter (HOSPITAL_COMMUNITY): Payer: Self-pay | Admitting: Obstetrics and Gynecology

## 2020-06-12 DIAGNOSIS — O212 Late vomiting of pregnancy: Secondary | ICD-10-CM | POA: Insufficient documentation

## 2020-06-12 DIAGNOSIS — Z87442 Personal history of urinary calculi: Secondary | ICD-10-CM | POA: Insufficient documentation

## 2020-06-12 DIAGNOSIS — O99613 Diseases of the digestive system complicating pregnancy, third trimester: Secondary | ICD-10-CM | POA: Diagnosis not present

## 2020-06-12 DIAGNOSIS — Z3A34 34 weeks gestation of pregnancy: Secondary | ICD-10-CM | POA: Diagnosis not present

## 2020-06-12 DIAGNOSIS — O4703 False labor before 37 completed weeks of gestation, third trimester: Secondary | ICD-10-CM | POA: Diagnosis not present

## 2020-06-12 DIAGNOSIS — R202 Paresthesia of skin: Secondary | ICD-10-CM | POA: Insufficient documentation

## 2020-06-12 DIAGNOSIS — O98513 Other viral diseases complicating pregnancy, third trimester: Secondary | ICD-10-CM | POA: Diagnosis not present

## 2020-06-12 DIAGNOSIS — A084 Viral intestinal infection, unspecified: Secondary | ICD-10-CM | POA: Insufficient documentation

## 2020-06-12 DIAGNOSIS — K529 Noninfective gastroenteritis and colitis, unspecified: Secondary | ICD-10-CM | POA: Diagnosis not present

## 2020-06-12 DIAGNOSIS — O479 False labor, unspecified: Secondary | ICD-10-CM

## 2020-06-12 DIAGNOSIS — Z79899 Other long term (current) drug therapy: Secondary | ICD-10-CM | POA: Insufficient documentation

## 2020-06-12 DIAGNOSIS — R6883 Chills (without fever): Secondary | ICD-10-CM | POA: Insufficient documentation

## 2020-06-12 LAB — URINALYSIS, ROUTINE W REFLEX MICROSCOPIC
Bilirubin Urine: NEGATIVE
Glucose, UA: NEGATIVE mg/dL
Hgb urine dipstick: NEGATIVE
Ketones, ur: 80 mg/dL — AB
Nitrite: NEGATIVE
Protein, ur: NEGATIVE mg/dL
Specific Gravity, Urine: 1.014 (ref 1.005–1.030)
pH: 6 (ref 5.0–8.0)

## 2020-06-12 LAB — CBC
HCT: 34 % — ABNORMAL LOW (ref 36.0–46.0)
Hemoglobin: 11.2 g/dL — ABNORMAL LOW (ref 12.0–15.0)
MCH: 28 pg (ref 26.0–34.0)
MCHC: 32.9 g/dL (ref 30.0–36.0)
MCV: 85 fL (ref 80.0–100.0)
Platelets: 141 10*3/uL — ABNORMAL LOW (ref 150–400)
RBC: 4 MIL/uL (ref 3.87–5.11)
RDW: 13 % (ref 11.5–15.5)
WBC: 10.7 10*3/uL — ABNORMAL HIGH (ref 4.0–10.5)
nRBC: 0 % (ref 0.0–0.2)

## 2020-06-12 LAB — COMPREHENSIVE METABOLIC PANEL
ALT: 13 U/L (ref 0–44)
AST: 20 U/L (ref 15–41)
Albumin: 2.7 g/dL — ABNORMAL LOW (ref 3.5–5.0)
Alkaline Phosphatase: 93 U/L (ref 38–126)
Anion gap: 11 (ref 5–15)
BUN: <5 mg/dL — ABNORMAL LOW (ref 6–20)
CO2: 24 mmol/L (ref 22–32)
Calcium: 8.4 mg/dL — ABNORMAL LOW (ref 8.9–10.3)
Chloride: 102 mmol/L (ref 98–111)
Creatinine, Ser: 0.61 mg/dL (ref 0.44–1.00)
GFR, Estimated: >60 mL/min (ref >60)
Glucose, Bld: 95 mg/dL (ref 70–99)
Potassium: 3.5 mmol/L (ref 3.5–5.1)
Sodium: 137 mmol/L (ref 135–145)
Total Bilirubin: 0.8 mg/dL (ref 0.3–1.2)
Total Protein: 6 g/dL — ABNORMAL LOW (ref 6.5–8.1)

## 2020-06-12 MED ORDER — ONDANSETRON HCL 4 MG PO TABS: 4.0000 mg | ORAL_TABLET | Freq: Every day | ORAL | 1 refills | Status: DC | PRN

## 2020-06-12 MED ORDER — ACETAMINOPHEN 500 MG PO TABS
1000.0000 mg | ORAL_TABLET | ORAL | Status: AC
  Administered 2020-06-12: 1000 mg via ORAL
  Filled 2020-06-12: qty 2

## 2020-06-12 MED ORDER — NIFEDIPINE 10 MG PO CAPS: 10.0000 mg | ORAL_CAPSULE | ORAL | 0 refills | Status: DC | PRN

## 2020-06-12 MED ORDER — ONDANSETRON HCL 4 MG/2ML IJ SOLN
4.0000 mg | Freq: Once | INTRAMUSCULAR | Status: AC
  Administered 2020-06-12: 4 mg via INTRAVENOUS
  Filled 2020-06-12: qty 2

## 2020-06-12 MED ORDER — NIFEDIPINE 10 MG PO CAPS
20.0000 mg | ORAL_CAPSULE | Freq: Once | ORAL | Status: AC
  Administered 2020-06-12: 20 mg via ORAL
  Filled 2020-06-12: qty 2

## 2020-06-12 MED ORDER — LACTATED RINGERS IV BOLUS
1000.0000 mL | Freq: Once | INTRAVENOUS | Status: AC
  Administered 2020-06-12: 1000 mL via INTRAVENOUS

## 2020-06-12 MED ORDER — CYCLOBENZAPRINE HCL 5 MG PO TABS
5.0000 mg | ORAL_TABLET | Freq: Once | ORAL | Status: AC
  Administered 2020-06-12: 5 mg via ORAL
  Filled 2020-06-12: qty 1

## 2020-06-12 NOTE — Discharge Instructions (Signed)
Safe Medications in Pregnancy   Acne: Benzoyl Peroxide Salicylic Acid  Backache/Headache: Tylenol: 2 regular strength every 4 hours OR              2 Extra strength every 6 hours  Colds/Coughs/Allergies: Benadryl (alcohol free) 25 mg every 6 hours as needed Breath right strips Claritin Cepacol throat lozenges Chloraseptic throat spray Cold-Eeze- up to three times per day Cough drops, alcohol free Flonase (by prescription only) Guaifenesin Mucinex Robitussin DM (plain only, alcohol free) Saline nasal spray/drops Sudafed (pseudoephedrine) & Actifed ** use only after [redacted] weeks gestation and if you do not have high blood pressure Tylenol Vicks Vaporub Zinc lozenges Zyrtec   Constipation: Colace Ducolax suppositories Fleet enema Glycerin suppositories Metamucil Milk of magnesia Miralax Senokot Smooth move tea  Diarrhea: Kaopectate Imodium A-D  *NO pepto Bismol  Hemorrhoids: Anusol Anusol HC Preparation H Tucks  Indigestion: Tums Maalox Mylanta Zantac  Pepcid  Insomnia: Benadryl (alcohol free) 25mg every 6 hours as needed Tylenol PM Unisom, no Gelcaps  Leg Cramps: Tums MagGel  Nausea/Vomiting:  Bonine Dramamine Emetrol Ginger extract Sea bands Meclizine  Nausea medication to take during pregnancy:  Unisom (doxylamine succinate 25 mg tablets) Take one tablet daily at bedtime. If symptoms are not adequately controlled, the dose can be increased to a maximum recommended dose of two tablets daily (1/2 tablet in the morning, 1/2 tablet mid-afternoon and one at bedtime). Vitamin B6 100mg tablets. Take one tablet twice a day (up to 200 mg per day).  Skin Rashes: Aveeno products Benadryl cream or 25mg every 6 hours as needed Calamine Lotion 1% cortisone cream  Yeast infection: Gyne-lotrimin 7 Monistat 7   **If taking multiple medications, please check labels to avoid duplicating the same active ingredients **take medication as directed on  the label ** Do not exceed 4000 mg of tylenol in 24 hours **Do not take medications that contain aspirin or ibuprofen      Abdominal Pain During Pregnancy Abdominal pain is common during pregnancy and has many possible causes. Some causes are more serious than others, and sometimes the cause is not known. Abdominal pain can be a sign that labor is starting. It can also be caused by normal growth of your baby causing stretching of muscles and ligaments during pregnancy. Always tell your health care provider if you have any abdominal pain. Follow these instructions at home:  Do not have sex or put anything in your vagina until your pain goes away completely.  Get plenty of rest until your pain improves.  Drink enough fluid to keep your urine pale yellow.  Take over-the-counter and prescription medicines only as told by your health care provider.  Keep all follow-up visits. This is important.   Contact a health care provider if:  Your pain continues or gets worse after resting.  You have lower abdominal pain that: ? Comes and goes at regular intervals. ? Spreads to your back. ? Is similar to menstrual cramps.  You have pain or burning when you urinate. Get help right away if:  You have a fever, chills, or shortness of breath.  You have vaginal bleeding.  You are leaking fluid or passing tissue from your vagina.  You have vomiting or diarrhea that lasts for more than 24 hours.  Your baby is moving less than usual.  You feel very weak or faint.  You develop severe pain in your upper abdomen. Summary  Abdominal pain is common during pregnancy and has many possible causes.  If   you experience abdominal pain during pregnancy, tell your health care provider right away.  Follow your health care provider's home care instructions and keep all follow-up visits as told. This information is not intended to replace advice given to you by your health care provider. Make sure you  discuss any questions you have with your health care provider. Document Revised: 12/16/2019 Document Reviewed: 12/16/2019 Elsevier Patient Education  2021 Elsevier Inc.  

## 2020-06-12 NOTE — MAU Note (Signed)
Presents with c/o ctxs that began last night @ 1130, also reports N/V that began last night too.  States unable to keep anything down.  States having tingling bilateral  thighs and bilateral arms from forearm to hands.  Endorses +FM.  Denies VB or LOF.

## 2020-06-12 NOTE — MAU Provider Note (Signed)
History     CSN: [id number]  Arrival date and time: 06/12/20 1630   None     Chief Complaint  Patient presents with  . Emesis  . Nausea  . Contractions   HPI   Patient is a g2p0010 at [redacted]w[redacted]d who presents for contractions, nausea, vomiting. Reports that she had lettuce yesterday which 'always makes her sick in pregnancy.' Started having some cramping last evening that worsened this morning. Rates contraction pain 8/10. Reports vomiting 4 times today and have bad cramping/contraction type pain. Also reports multiple episodes of diarrhea today. Endorses chills but no fevers. Has been able to keep some PO down, including pedialyte. Reports decreased urine output but no pain with urination, no blood in urine. Has had kidney stones in past but this doesn't feel like that. Reports good fetal movement, denies leakage of fluid or vaginal bleeding. No sick contacts. Denies cough, sore throat, headaches, congestion. She does also report having some tingling in her hands and upper thighs that seemed to improve a little when drinking pedialyte. No weakness. Has been able to ambulate without issue.   OB History    Gravida  2   Para      Term      Preterm      AB  1   Living        SAB      IAB      Ectopic  1   Multiple      Live Births              Past Medical History:  Diagnosis Date  . Heart murmur    Hx as a child, no problems as an adult  . IBS (irritable bowel syndrome)    no meds  . Kidney stone     Past Surgical History:  Procedure Laterality Date  . LAPAROSCOPY N/A 12/10/2015   Procedure: Operative Laparoscopy, Left Salpingectomy;  Surgeon: Edwinna Areola, DO;  Location: [facility];  Service: Gynecology;  Laterality: N/A;    No family history on file.  Social History   Tobacco Use  . Smoking status: Never Smoker  . Smokeless tobacco: Never Used  Vaping Use  . Vaping Use: Never used  Substance Use Topics  . Alcohol use: No  . Drug use: No     Allergies: No Known Allergies  Medications Prior to Admission  Medication Sig Dispense Refill Last Dose  . cyclobenzaprine (FLEXERIL) 10 MG tablet Take 1 tablet (10 mg total) by mouth 3 (three) times daily as needed for muscle spasms. 20 tablet 0   . ondansetron (ZOFRAN ODT) 4 MG disintegrating tablet Take 1 tablet (4 mg total) by mouth every 8 (eight) hours as needed for nausea or vomiting. 20 tablet 0   . Prenatal Vit-Fe Fumarate-FA (PRENATAL MULTIVITAMIN) TABS tablet Take 1 tablet by mouth daily at 12 noon.     . promethazine (PHENERGAN) 25 MG tablet Take 1 tablet (25 mg total) by mouth every 6 (six) hours as needed for nausea or vomiting. 12 tablet 0   . tamsulosin (FLOMAX) 0.4 MG CAPS capsule Take 1 capsule (0.4 mg total) by mouth daily. 20 capsule 0     Review of Systems  Constitutional: Negative for activity change and appetite change.  Respiratory: Negative for cough, chest tightness and shortness of breath.   Gastrointestinal: Positive for abdominal pain, diarrhea, nausea and vomiting.  Genitourinary: Negative for difficulty urinating, dyspareunia and pelvic pain.  Neurological: Negative for dizziness and headaches.  Physical Exam   Height 5' (1.524 m), weight 67.9 kg.  Physical Exam Vitals and nursing note reviewed.  Constitutional:      Appearance: Normal appearance.  Cardiovascular:     Rate and Rhythm: Tachycardia present.  Pulmonary:     Effort: Pulmonary effort is normal.  Abdominal:     Palpations: Abdomen is soft.     Comments: Non distended, soft, mild tenderness to palpation of RUQ and LUQ. Otherwise nontender. No CVA tenderness  Neurological:     Mental Status: She is alert.   CVE in presence of chaperone: Fingertip/thick/high   MAU Course  Procedures  MDM Pt evaluated at bedside IV LR bolus, tylenol, flexeril, zofran, procardia  CBC, CMP, UA - WBC 10.7 UA notable for 80 ketones, small LE, no nitrite. CMP unremarkable.  NST: Baseline 155, mod  variability, pos accels, no decels  Ctx: initially q2 min, spaced out with interventions  Feels improved with fluids, medication. Contractions have spaced and no longer painful.   Assessment and Plan  Viral gastroenteritis, False labor -improved w hydration, dc home with return precautions. Script given for procardia, zofran. Counseled on hydration, OTC meds.   Gita Kudo 06/12/2020, 4:54 PM

## 2020-06-18 DIAGNOSIS — O403XX0 Polyhydramnios, third trimester, not applicable or unspecified: Secondary | ICD-10-CM | POA: Diagnosis not present

## 2020-06-18 DIAGNOSIS — Z3A35 35 weeks gestation of pregnancy: Secondary | ICD-10-CM | POA: Diagnosis not present

## 2020-06-25 DIAGNOSIS — Z3685 Encounter for antenatal screening for Streptococcus B: Secondary | ICD-10-CM | POA: Diagnosis not present

## 2020-06-25 DIAGNOSIS — O409XX0 Polyhydramnios, unspecified trimester, not applicable or unspecified: Secondary | ICD-10-CM | POA: Diagnosis not present

## 2020-06-25 DIAGNOSIS — Z3A36 36 weeks gestation of pregnancy: Secondary | ICD-10-CM | POA: Diagnosis not present

## 2020-06-25 LAB — OB RESULTS CONSOLE GBS: GBS: NEGATIVE

## 2020-06-30 DIAGNOSIS — O409XX0 Polyhydramnios, unspecified trimester, not applicable or unspecified: Secondary | ICD-10-CM | POA: Diagnosis not present

## 2020-06-30 DIAGNOSIS — Z3A37 37 weeks gestation of pregnancy: Secondary | ICD-10-CM | POA: Diagnosis not present

## 2020-07-09 DIAGNOSIS — Z3A38 38 weeks gestation of pregnancy: Secondary | ICD-10-CM | POA: Diagnosis not present

## 2020-07-09 DIAGNOSIS — O24419 Gestational diabetes mellitus in pregnancy, unspecified control: Secondary | ICD-10-CM | POA: Diagnosis not present

## 2020-07-09 DIAGNOSIS — O409XX1 Polyhydramnios, unspecified trimester, fetus 1: Secondary | ICD-10-CM | POA: Diagnosis not present

## 2020-07-11 ENCOUNTER — Inpatient Hospital Stay (HOSPITAL_COMMUNITY): Payer: BC Managed Care – PPO

## 2020-07-11 ENCOUNTER — Inpatient Hospital Stay (HOSPITAL_COMMUNITY): Payer: BC Managed Care – PPO | Admitting: Anesthesiology

## 2020-07-11 ENCOUNTER — Inpatient Hospital Stay (HOSPITAL_COMMUNITY)
Admission: AD | Admit: 2020-07-11 | Discharge: 2020-07-14 | DRG: 788 | Disposition: A | Discharge status: Home / Self Care | Payer: BC Managed Care – PPO | Attending: Obstetrics and Gynecology | Admitting: Obstetrics and Gynecology

## 2020-07-11 ENCOUNTER — Encounter (HOSPITAL_COMMUNITY): Payer: Self-pay | Admitting: Obstetrics and Gynecology

## 2020-07-11 ENCOUNTER — Other Ambulatory Visit: Payer: Self-pay

## 2020-07-11 DIAGNOSIS — O403XX0 Polyhydramnios, third trimester, not applicable or unspecified: Secondary | ICD-10-CM | POA: Diagnosis not present

## 2020-07-11 DIAGNOSIS — Z23 Encounter for immunization: Secondary | ICD-10-CM

## 2020-07-11 DIAGNOSIS — O24419 Gestational diabetes mellitus in pregnancy, unspecified control: Secondary | ICD-10-CM | POA: Diagnosis present

## 2020-07-11 DIAGNOSIS — O24429 Gestational diabetes mellitus in childbirth, unspecified control: Secondary | ICD-10-CM | POA: Diagnosis not present

## 2020-07-11 DIAGNOSIS — Z20822 Contact with and (suspected) exposure to covid-19: Secondary | ICD-10-CM | POA: Diagnosis not present

## 2020-07-11 DIAGNOSIS — Z3A39 39 weeks gestation of pregnancy: Secondary | ICD-10-CM

## 2020-07-11 DIAGNOSIS — Z98891 History of uterine scar from previous surgery: Secondary | ICD-10-CM

## 2020-07-11 DIAGNOSIS — Z87442 Personal history of urinary calculi: Secondary | ICD-10-CM

## 2020-07-11 DIAGNOSIS — O24425 Gestational diabetes mellitus in childbirth, controlled by oral hypoglycemic drugs: Secondary | ICD-10-CM | POA: Diagnosis not present

## 2020-07-11 LAB — CBC
HCT: 29.9 % — ABNORMAL LOW (ref 36.0–46.0)
HCT: 31.7 % — ABNORMAL LOW (ref 36.0–46.0)
Hemoglobin: 10.1 g/dL — ABNORMAL LOW (ref 12.0–15.0)
Hemoglobin: 10.7 g/dL — ABNORMAL LOW (ref 12.0–15.0)
MCH: 27.4 pg (ref 26.0–34.0)
MCH: 27.7 pg (ref 26.0–34.0)
MCHC: 33.8 g/dL (ref 30.0–36.0)
MCHC: 33.8 g/dL (ref 30.0–36.0)
MCV: 81.1 fL (ref 80.0–100.0)
MCV: 82.1 fL (ref 80.0–100.0)
Platelets: 126 10*3/uL — ABNORMAL LOW (ref 150–400)
Platelets: 135 10*3/uL — ABNORMAL LOW (ref 150–400)
RBC: 3.64 MIL/uL — ABNORMAL LOW (ref 3.87–5.11)
RBC: 3.91 MIL/uL (ref 3.87–5.11)
RDW: 13.2 % (ref 11.5–15.5)
RDW: 13.6 % (ref 11.5–15.5)
WBC: 10.9 10*3/uL — ABNORMAL HIGH (ref 4.0–10.5)
WBC: 9.8 10*3/uL (ref 4.0–10.5)
nRBC: 0 % (ref 0.0–0.2)
nRBC: 0 % (ref 0.0–0.2)

## 2020-07-11 LAB — GLUCOSE, CAPILLARY
Glucose-Capillary: 83 mg/dL (ref 70–99)
Glucose-Capillary: 69 mg/dL — ABNORMAL LOW (ref 70–99)
Glucose-Capillary: 84 mg/dL (ref 70–99)
Glucose-Capillary: 75 mg/dL (ref 70–99)
Glucose-Capillary: 116 mg/dL — ABNORMAL HIGH (ref 70–99)

## 2020-07-11 LAB — TYPE AND SCREEN
ABO/RH(D): O POS
Antibody Screen: NEGATIVE

## 2020-07-11 LAB — RPR: RPR Ser Ql: NONREACTIVE

## 2020-07-11 LAB — SARS CORONAVIRUS 2 (TAT 6-24 HRS): SARS Coronavirus 2: NEGATIVE

## 2020-07-11 MED ORDER — TERBUTALINE SULFATE 1 MG/ML IJ SOLN: 0.2500 mg | Freq: Once | INTRAMUSCULAR | Status: DC | PRN

## 2020-07-11 MED ORDER — LIDOCAINE HCL (PF) 1 % IJ SOLN: 30.0000 mL | INTRAMUSCULAR | Status: DC | PRN

## 2020-07-11 MED ORDER — SOD CITRATE-CITRIC ACID 500-334 MG/5ML PO SOLN
30.0000 mL | ORAL | Status: DC | PRN
  Administered 2020-07-11: 30 mL via ORAL
  Filled 2020-07-11 (×2): qty 15

## 2020-07-11 MED ORDER — OXYTOCIN BOLUS FROM INFUSION: 333.0000 mL | Freq: Once | INTRAVENOUS | Status: DC

## 2020-07-11 MED ORDER — EPHEDRINE 5 MG/ML INJ: 10.0000 mg | INTRAVENOUS | Status: DC | PRN

## 2020-07-11 MED ORDER — OXYCODONE-ACETAMINOPHEN 5-325 MG PO TABS: 2.0000 | ORAL_TABLET | ORAL | Status: DC | PRN

## 2020-07-11 MED ORDER — FENTANYL-BUPIVACAINE-NACL 0.5-0.125-0.9 MG/250ML-% EP SOLN
12.0000 mL/h | EPIDURAL | Status: DC | PRN
  Administered 2020-07-11 – 2020-07-12 (×2): 12 mL/h via EPIDURAL
  Filled 2020-07-11 (×2): qty 250

## 2020-07-11 MED ORDER — LACTATED RINGERS IV SOLN
500.0000 mL | Freq: Once | INTRAVENOUS | Status: AC
  Administered 2020-07-11: 500 mL via INTRAVENOUS

## 2020-07-11 MED ORDER — BUTORPHANOL TARTRATE 1 MG/ML IJ SOLN
1.0000 mg | INTRAMUSCULAR | Status: DC | PRN
  Administered 2020-07-11 (×2): 1 mg via INTRAVENOUS
  Filled 2020-07-11 (×2): qty 1

## 2020-07-11 MED ORDER — DIPHENHYDRAMINE HCL 50 MG/ML IJ SOLN: 12.5000 mg | INTRAMUSCULAR | Status: DC | PRN

## 2020-07-11 MED ORDER — MISOPROSTOL 25 MCG QUARTER TABLET
25.0000 ug | ORAL_TABLET | ORAL | Status: DC | PRN
  Administered 2020-07-11 (×2): 25 ug via VAGINAL
  Filled 2020-07-11 (×2): qty 1

## 2020-07-11 MED ORDER — ACETAMINOPHEN 325 MG PO TABS: 650.0000 mg | ORAL_TABLET | ORAL | Status: DC | PRN

## 2020-07-11 MED ORDER — LACTATED RINGERS IV SOLN: INTRAVENOUS | Status: DC

## 2020-07-11 MED ORDER — ONDANSETRON HCL 4 MG/2ML IJ SOLN: 4.0000 mg | Freq: Four times a day (QID) | INTRAMUSCULAR | Status: DC | PRN

## 2020-07-11 MED ORDER — LACTATED RINGERS IV SOLN
500.0000 mL | INTRAVENOUS | Status: DC | PRN
Start: 2020-07-11 — End: 2020-07-12

## 2020-07-11 MED ORDER — LIDOCAINE-EPINEPHRINE (PF) 2 %-1:200000 IJ SOLN
INTRAMUSCULAR | Status: DC | PRN
  Administered 2020-07-11 – 2020-07-12 (×4): 5 mL via EPIDURAL

## 2020-07-11 MED ORDER — PHENYLEPHRINE 40 MCG/ML (10ML) SYRINGE FOR IV PUSH (FOR BLOOD PRESSURE SUPPORT)
80.0000 ug | PREFILLED_SYRINGE | INTRAVENOUS | Status: DC | PRN
  Filled 2020-07-11: qty 10

## 2020-07-11 MED ORDER — PHENYLEPHRINE 40 MCG/ML (10ML) SYRINGE FOR IV PUSH (FOR BLOOD PRESSURE SUPPORT): 80.0000 ug | PREFILLED_SYRINGE | INTRAVENOUS | Status: DC | PRN

## 2020-07-11 MED ORDER — OXYTOCIN-SODIUM CHLORIDE 30-0.9 UT/500ML-% IV SOLN
1.0000 m[IU]/min | INTRAVENOUS | Status: DC
  Administered 2020-07-11: 2 m[IU]/min via INTRAVENOUS

## 2020-07-11 MED ORDER — FENTANYL-BUPIVACAINE-NACL 0.5-0.125-0.9 MG/250ML-% EP SOLN: 12.0000 mL/h | EPIDURAL | Status: DC | PRN

## 2020-07-11 MED ORDER — LACTATED RINGERS IV SOLN
500.0000 mL | Freq: Once | INTRAVENOUS | Status: DC
  Administered 2020-07-12: 1 mL via INTRAVENOUS

## 2020-07-11 MED ORDER — FENTANYL CITRATE (PF) 100 MCG/2ML IJ SOLN
INTRAMUSCULAR | Status: AC
  Filled 2020-07-11: qty 2

## 2020-07-11 MED ORDER — OXYTOCIN-SODIUM CHLORIDE 30-0.9 UT/500ML-% IV SOLN
2.5000 [IU]/h | INTRAVENOUS | Status: DC
  Administered 2020-07-12: 500 m[IU]/min via INTRAVENOUS
  Filled 2020-07-11: qty 500

## 2020-07-11 NOTE — Progress Notes (Addendum)
Patient ID: April Charles, female   DOB: [birth year], 30 y.o.   MRN: [id number] Pt comfortable now with epidural. No complaints VSS CAT 1, 120s Ctxs q SVE - foley catheter easily removed. Now 4-5/60/-2           A/P: Progressing well in labor spontaneously          AROM - copious clear fluid noted          Expectant mgmt. Augment if indicated

## 2020-07-11 NOTE — H&P (Signed)
April Charles is a 30 y.o. G48P0010 female presenting for scheduled iol. Pt is dated per 7week [place]. 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 Pregnancy has also been complicated by polyhydramnious., kidney stones and she is RNI. She declined MaterniT and inheritest.   She has a colposcopy during pregnancy. She is GBS neg and Sars covid neg. . OB History    Gravida  2   Para      Term      Preterm      AB  1   Living        SAB      IAB      Ectopic  1   Multiple      Live Births             Past Medical History:  Diagnosis Date  . Heart murmur    Hx as a child, no problems as an adult  . IBS (irritable bowel syndrome)    no meds  . Kidney stone    Past Surgical History:  Procedure Laterality Date  . LAPAROSCOPY N/A 12/10/2015   Procedure: Operative Laparoscopy, Left Salpingectomy;  Surgeon: Edwinna Areola, DO;  Location: [facility];  Service: Gynecology;  Laterality: N/A;   Family History: family history is not on file. Social History:  reports that she has never smoked. She has never used smokeless tobacco. She reports that she does not drink alcohol and does not use drugs.     Maternal Diabetes: Yes:  Diabetes Type:  Insulin/Medication controlled Genetic Screening: Declined Maternal Ultrasounds/Referrals: Normal Fetal Ultrasounds or other Referrals:  None Maternal Substance Abuse:  No Significant Maternal Medications:  Meds include: Other: metformin Significant Maternal Lab Results:  Group B Strep negative Other Comments:  None  Review of Systems  Constitutional: Positive for fatigue. Negative for activity change.  Eyes: Negative for photophobia and visual disturbance.  Respiratory: Negative for chest tightness and shortness of breath.   Cardiovascular: Negative for chest pain, palpitations and leg swelling.  Gastrointestinal: Positive for abdominal pain. Negative for abdominal distention.  Genitourinary: Positive for pelvic  pain.  Musculoskeletal: Negative for myalgias.  Neurological: Negative for headaches.  Psychiatric/Behavioral: The patient is nervous/anxious.    Maternal Medical History:  Reason for admission: IOL for GDMA2  Contractions: Onset was 3-5 hours ago.   Frequency: regular.   Perceived severity is strong.    Fetal activity: Perceived fetal activity is normal.    Prenatal complications: Polyhydramnios.   Prenatal Complications - Diabetes: gestational. Diabetes is managed by oral agent (monotherapy).      Dilation: 1.5 Effacement (%): 50 Station: -3 Exam by:: Dr. Mindi Slicker Blood pressure 129/81, pulse 84, temperature 98.5 F (36.9 C), temperature source Oral, resp. rate 16, height 5' (1.524 m), weight 70.4 kg. Maternal Exam:  Uterine Assessment: Contraction strength is firm.  Contraction frequency is regular.   Abdomen: Patient reports generalized tenderness.  Estimated fetal weight is AGA.   Fetal presentation: vertex  Introitus: Normal vulva. Vulva is negative for condylomata and lesion.  Normal vagina.  Vagina is negative for condylomata.  Pelvis: adequate for delivery.   Cervix: Cervix evaluated by digital exam.     Fetal Exam Fetal Monitor Review: Baseline rate: 120.  Variability: moderate (6-25 bpm).   Pattern: accelerations present and no decelerations.    Fetal State Assessment: Category I - tracings are normal.     Physical Exam Vitals and nursing note reviewed. Exam conducted with a chaperone  present.  Constitutional:      Appearance: Normal appearance.  Cardiovascular:     Pulses: Normal pulses.  Pulmonary:     Effort: Pulmonary effort is normal.  Abdominal:     Tenderness: There is generalized abdominal tenderness.  Genitourinary:    General: Normal vulva.  Vulva is no lesion.  Musculoskeletal:        General: Normal range of motion.     Cervical back: Normal range of motion.  Skin:    General: Skin is warm.     Capillary Refill: Capillary refill  takes 2 to 3 seconds.  Neurological:     General: No focal deficit present.     Mental Status: She is alert and oriented to person, place, and time. Mental status is at baseline.  Psychiatric:        Mood and Affect: Mood normal.        Behavior: Behavior normal.        Thought Content: Thought content normal.        Judgment: Judgment normal.     Prenatal labs: ABO, Rh: --/--/O POS (03/27 0028) Antibody: NEG (03/27 0028) Rubella:   RPR:    HBsAg:    HIV:    GBS:     Assessment/Plan: 30yo G2P0010 female with GDMA2 and polyhydramnious here for IOL - Admitted - S/P cytotec overnight x 3 - 1/50/-3; ballotable - Cooks catheter placed with 60u/30V - Augment with pitocin if contractions space out - GBS neg - Sars covid neg - Anticipate svd  [name] 07/11/2020, 10:37 AM

## 2020-07-11 NOTE — Anesthesia Preprocedure Evaluation (Addendum)
Anesthesia Evaluation  Patient identified by MRN, date of birth, ID band Patient awake    Reviewed: Allergy & Precautions, NPO status , Patient's Chart, lab work & pertinent test results  Airway Mallampati: II       Dental no notable dental hx.    Pulmonary    Pulmonary exam normal        Cardiovascular Normal cardiovascular exam     Neuro/Psych    GI/Hepatic   Endo/Other  diabetes, Gestational  Renal/GU      Musculoskeletal   Abdominal   Peds  Hematology   Anesthesia Other Findings   Reproductive/Obstetrics (+) Pregnancy                            Anesthesia Physical Anesthesia Plan  ASA: II and emergent  Anesthesia Plan: Epidural   Post-op Pain Management:    Induction:   PONV Risk Score and Plan:   Airway Management Planned: Natural Airway  Additional Equipment: None  Intra-op Plan:   Post-operative Plan:   Informed Consent:   Plan Discussed with:   Anesthesia Plan Comments: (**Epidural to C-section 2/2 arrest of dilatation**  Lab Results      Component                Value               Date                      WBC                      10.9 (H)            07/11/2020                HGB                      10.7 (L)            07/11/2020                HCT                      31.7 (L)            07/11/2020                MCV                      81.1                07/11/2020                PLT                      135 (L)             07/11/2020            Lab Results      Component                Value               Date                      WBC  9.8                 07/11/2020                HGB                      10.1 (L)            07/11/2020                HCT                      29.9 (L)            07/11/2020                MCV                      82.1                07/11/2020                PLT                      126 (L)              07/11/2020           )     Anesthesia Quick Evaluation

## 2020-07-11 NOTE — Progress Notes (Signed)
Patient ID: April Charles, female   DOB: 1990-12-30, 30 y.o.   MRN: [id number] Pt reports increasing pelvic pain with contractions. Requests rebolus of epidural.  Per nurse who checked her just before I arrived, Cervix 6cm dil Cat 1, 145, occ variable  Ctx q 2-84mins  Pit at  A/P: Plan to recheck pt after epidural redosed         Expectant mgmt.

## 2020-07-11 NOTE — Anesthesia Procedure Notes (Signed)
Epidural Patient location during procedure: OB Start time: 07/11/2020 1:04 PM End time: 07/11/2020 1:10 PM  Staffing Anesthesiologist: Shelton Silvas, MD Performed: anesthesiologist   Preanesthetic Checklist Completed: patient identified, IV checked, site marked, risks and benefits discussed, surgical consent, monitors and equipment checked, pre-op evaluation and timeout performed  Epidural Patient position: sitting Prep: DuraPrep Patient monitoring: heart rate, continuous pulse ox and blood pressure Approach: midline Location: L3-L4 Injection technique: LOR saline  Needle:  Needle type: Tuohy  Needle gauge: 17 G Needle length: 9 cm Catheter type: closed end flexible Catheter size: 20 Guage Test dose: negative and 1.5% lidocaine  Assessment Events: blood not aspirated, injection not painful, no injection resistance and no paresthesia  Additional Notes LOR @ 4  Patient identified. Risks/Benefits/Options discussed with patient including but not limited to bleeding, infection, nerve damage, paralysis, failed block, incomplete pain control, headache, blood pressure changes, nausea, vomiting, reactions to medications, itching and postpartum back pain. Confirmed with bedside nurse the patient's most recent platelet count. Confirmed with patient that they are not currently taking any anticoagulation, have any bleeding history or any family history of bleeding disorders. Patient expressed understanding and wished to proceed. All questions were answered. Sterile technique was used throughout the entire procedure. Please see nursing notes for vital signs. Test dose was given through epidural catheter and negative prior to continuing to dose epidural or start infusion. Warning signs of high block given to the patient including shortness of breath, tingling/numbness in hands, complete motor block, or any concerning symptoms with instructions to call for help. Patient was given instructions on  fall risk and not to get out of bed. All questions and concerns addressed with instructions to call with any issues or inadequate analgesia.    Reason for block:procedure for pain

## 2020-07-12 ENCOUNTER — Encounter (HOSPITAL_COMMUNITY): Payer: Self-pay | Admitting: Obstetrics and Gynecology

## 2020-07-12 ENCOUNTER — Encounter (HOSPITAL_COMMUNITY)
Admission: AD | Disposition: A | Discharge status: Home / Self Care | Payer: Self-pay | Source: Home / Self Care | Attending: Obstetrics and Gynecology

## 2020-07-12 DIAGNOSIS — Z98891 History of uterine scar from previous surgery: Secondary | ICD-10-CM

## 2020-07-12 LAB — GLUCOSE, CAPILLARY
Glucose-Capillary: 100 mg/dL — ABNORMAL HIGH (ref 70–99)
Glucose-Capillary: 98 mg/dL (ref 70–99)
Glucose-Capillary: 118 mg/dL — ABNORMAL HIGH (ref 70–99)

## 2020-07-12 SURGERY — Surgical Case
Anesthesia: Epidural | Wound class: Clean Contaminated

## 2020-07-12 MED ORDER — NALOXONE HCL 4 MG/10ML IJ SOLN
1.0000 ug/kg/h | INTRAMUSCULAR | Status: DC | PRN
  Filled 2020-07-12: qty 5

## 2020-07-12 MED ORDER — SCOPOLAMINE 1 MG/3DAYS TD PT72
MEDICATED_PATCH | TRANSDERMAL | Status: AC
  Filled 2020-07-12: qty 1

## 2020-07-12 MED ORDER — ACETAMINOPHEN 500 MG PO TABS: 1000.0000 mg | ORAL_TABLET | Freq: Four times a day (QID) | ORAL | Status: DC

## 2020-07-12 MED ORDER — FENTANYL CITRATE (PF) 100 MCG/2ML IJ SOLN
25.0000 ug | INTRAMUSCULAR | Status: DC | PRN
Start: 2020-07-12 — End: 2020-07-12
  Administered 2020-07-12: 25 ug via INTRAVENOUS

## 2020-07-12 MED ORDER — NALBUPHINE HCL 10 MG/ML IJ SOLN
5.0000 mg | Freq: Once | INTRAMUSCULAR | Status: AC | PRN
Start: 2020-07-12 — End: 2020-07-12

## 2020-07-12 MED ORDER — DEXMEDETOMIDINE (PRECEDEX) IN NS 20 MCG/5ML (4 MCG/ML) IV SYRINGE
PREFILLED_SYRINGE | INTRAVENOUS | Status: DC | PRN
  Administered 2020-07-12 (×3): 20 ug via INTRAVENOUS

## 2020-07-12 MED ORDER — KETOROLAC TROMETHAMINE 30 MG/ML IJ SOLN: 30.0000 mg | Freq: Once | INTRAMUSCULAR | Status: DC

## 2020-07-12 MED ORDER — FENTANYL CITRATE (PF) 100 MCG/2ML IJ SOLN
INTRAMUSCULAR | Status: AC
  Filled 2020-07-12: qty 2

## 2020-07-12 MED ORDER — SIMETHICONE 80 MG PO CHEW: 80.0000 mg | CHEWABLE_TABLET | ORAL | Status: DC | PRN

## 2020-07-12 MED ORDER — DIBUCAINE (PERIANAL) 1 % EX OINT: 1.0000 "application " | TOPICAL_OINTMENT | CUTANEOUS | Status: DC | PRN

## 2020-07-12 MED ORDER — ACETAMINOPHEN 500 MG PO TABS
1000.0000 mg | ORAL_TABLET | Freq: Four times a day (QID) | ORAL | Status: DC
  Administered 2020-07-12 – 2020-07-14 (×7): 1000 mg via ORAL
  Filled 2020-07-12 (×7): qty 2

## 2020-07-12 MED ORDER — KETOROLAC TROMETHAMINE 30 MG/ML IJ SOLN
INTRAMUSCULAR | Status: AC
  Filled 2020-07-12: qty 1

## 2020-07-12 MED ORDER — NALOXONE HCL 0.4 MG/ML IJ SOLN: 0.4000 mg | INTRAMUSCULAR | Status: DC | PRN

## 2020-07-12 MED ORDER — DIPHENHYDRAMINE HCL 25 MG PO CAPS: 25.0000 mg | ORAL_CAPSULE | ORAL | Status: DC | PRN

## 2020-07-12 MED ORDER — PROMETHAZINE HCL 25 MG/ML IJ SOLN: 6.2500 mg | INTRAMUSCULAR | Status: DC | PRN

## 2020-07-12 MED ORDER — MEPERIDINE HCL 25 MG/ML IJ SOLN
6.2500 mg | INTRAMUSCULAR | Status: DC | PRN
Start: 2020-07-12 — End: 2020-07-12

## 2020-07-12 MED ORDER — BUPIVACAINE HCL (PF) 0.25 % IJ SOLN
INTRAMUSCULAR | Status: DC | PRN
  Administered 2020-07-11 (×2): 5 mL via EPIDURAL

## 2020-07-12 MED ORDER — IBUPROFEN 800 MG PO TABS
800.0000 mg | ORAL_TABLET | Freq: Three times a day (TID) | ORAL | Status: DC
  Administered 2020-07-13 – 2020-07-14 (×4): 800 mg via ORAL
  Filled 2020-07-12 (×4): qty 1

## 2020-07-12 MED ORDER — SCOPOLAMINE 1 MG/3DAYS TD PT72
1.0000 | MEDICATED_PATCH | Freq: Once | TRANSDERMAL | Status: DC
  Administered 2020-07-12: 1.5 mg via TRANSDERMAL

## 2020-07-12 MED ORDER — OXYCODONE-ACETAMINOPHEN 5-325 MG PO TABS
1.0000 | ORAL_TABLET | Freq: Once | ORAL | Status: DC
Start: 2020-07-12 — End: 2020-07-12

## 2020-07-12 MED ORDER — KETOROLAC TROMETHAMINE 30 MG/ML IJ SOLN
30.0000 mg | Freq: Four times a day (QID) | INTRAMUSCULAR | Status: AC | PRN
  Administered 2020-07-12: 30 mg via INTRAMUSCULAR

## 2020-07-12 MED ORDER — NALBUPHINE HCL 10 MG/ML IJ SOLN
5.0000 mg | Freq: Once | INTRAMUSCULAR | Status: AC | PRN
  Administered 2020-07-12: 5 mg via INTRAVENOUS

## 2020-07-12 MED ORDER — FENTANYL CITRATE (PF) 100 MCG/2ML IJ SOLN
INTRAMUSCULAR | Status: DC | PRN
  Administered 2020-07-12: 100 ug via INTRAVENOUS

## 2020-07-12 MED ORDER — PRENATAL MULTIVITAMIN CH
1.0000 | ORAL_TABLET | Freq: Every day | ORAL | Status: DC
  Administered 2020-07-13: 1 via ORAL
  Filled 2020-07-12: qty 1

## 2020-07-12 MED ORDER — MORPHINE SULFATE (PF) 0.5 MG/ML IJ SOLN
INTRAMUSCULAR | Status: AC
  Filled 2020-07-12: qty 10

## 2020-07-12 MED ORDER — SENNOSIDES-DOCUSATE SODIUM 8.6-50 MG PO TABS
2.0000 | ORAL_TABLET | Freq: Every day | ORAL | Status: DC
  Administered 2020-07-13: 2 via ORAL
  Filled 2020-07-12: qty 2

## 2020-07-12 MED ORDER — FENTANYL CITRATE (PF) 100 MCG/2ML IJ SOLN
INTRAMUSCULAR | Status: DC | PRN
  Administered 2020-07-11 (×2): 50 ug via EPIDURAL

## 2020-07-12 MED ORDER — DEXMEDETOMIDINE (PRECEDEX) IN NS 20 MCG/5ML (4 MCG/ML) IV SYRINGE
PREFILLED_SYRINGE | INTRAVENOUS | Status: AC
  Filled 2020-07-12: qty 15

## 2020-07-12 MED ORDER — METHYLERGONOVINE MALEATE 0.2 MG/ML IJ SOLN
INTRAMUSCULAR | Status: DC | PRN
  Administered 2020-07-12: .2 mg via INTRAMUSCULAR

## 2020-07-12 MED ORDER — OXYTOCIN-SODIUM CHLORIDE 30-0.9 UT/500ML-% IV SOLN: 2.5000 [IU]/h | INTRAVENOUS | Status: AC

## 2020-07-12 MED ORDER — ONDANSETRON HCL 4 MG/2ML IJ SOLN: 4.0000 mg | Freq: Three times a day (TID) | INTRAMUSCULAR | Status: DC | PRN

## 2020-07-12 MED ORDER — MORPHINE SULFATE (PF) 0.5 MG/ML IJ SOLN
INTRAMUSCULAR | Status: DC | PRN
  Administered 2020-07-12: 3 mg via EPIDURAL

## 2020-07-12 MED ORDER — ZOLPIDEM TARTRATE 5 MG PO TABS: 5.0000 mg | ORAL_TABLET | Freq: Every evening | ORAL | Status: DC | PRN

## 2020-07-12 MED ORDER — OXYTOCIN-SODIUM CHLORIDE 30-0.9 UT/500ML-% IV SOLN
INTRAVENOUS | Status: AC
  Filled 2020-07-12: qty 500

## 2020-07-12 MED ORDER — SODIUM CHLORIDE 0.9% FLUSH: 3.0000 mL | INTRAVENOUS | Status: DC | PRN

## 2020-07-12 MED ORDER — KETOROLAC TROMETHAMINE 30 MG/ML IJ SOLN
30.0000 mg | Freq: Four times a day (QID) | INTRAMUSCULAR | Status: AC | PRN
  Administered 2020-07-12 (×2): 30 mg via INTRAVENOUS
  Filled 2020-07-12 (×2): qty 1

## 2020-07-12 MED ORDER — NALBUPHINE HCL 10 MG/ML IJ SOLN: 5.0000 mg | INTRAMUSCULAR | Status: DC | PRN

## 2020-07-12 MED ORDER — COCONUT OIL OIL: 1.0000 "application " | TOPICAL_OIL | Status: DC | PRN

## 2020-07-12 MED ORDER — OXYCODONE HCL 5 MG PO TABS
5.0000 mg | ORAL_TABLET | ORAL | Status: DC | PRN
  Administered 2020-07-13: 10 mg via ORAL
  Administered 2020-07-13: 5 mg via ORAL
  Filled 2020-07-12: qty 2
  Filled 2020-07-12: qty 1

## 2020-07-12 MED ORDER — MENTHOL 3 MG MT LOZG: 1.0000 | LOZENGE | OROMUCOSAL | Status: DC | PRN

## 2020-07-12 MED ORDER — WITCH HAZEL-GLYCERIN EX PADS: 1.0000 "application " | MEDICATED_PAD | CUTANEOUS | Status: DC | PRN

## 2020-07-12 MED ORDER — DIPHENHYDRAMINE HCL 50 MG/ML IJ SOLN: 12.5000 mg | INTRAMUSCULAR | Status: DC | PRN

## 2020-07-12 MED ORDER — DEXAMETHASONE SODIUM PHOSPHATE 4 MG/ML IJ SOLN
INTRAMUSCULAR | Status: DC | PRN
  Administered 2020-07-12: 10 mg via INTRAVENOUS

## 2020-07-12 MED ORDER — ONDANSETRON HCL 4 MG/2ML IJ SOLN
INTRAMUSCULAR | Status: DC | PRN
  Administered 2020-07-12: 4 mg via INTRAVENOUS

## 2020-07-12 MED ORDER — LACTATED RINGERS IV SOLN: INTRAVENOUS | Status: DC

## 2020-07-12 MED ORDER — NALBUPHINE HCL 10 MG/ML IJ SOLN
5.0000 mg | INTRAMUSCULAR | Status: DC | PRN
  Administered 2020-07-12 (×2): 5 mg via INTRAVENOUS
  Filled 2020-07-12 (×3): qty 1

## 2020-07-12 MED ORDER — TETANUS-DIPHTH-ACELL PERTUSSIS 5-2.5-18.5 LF-MCG/0.5 IM SUSY: 0.5000 mL | PREFILLED_SYRINGE | Freq: Once | INTRAMUSCULAR | Status: DC

## 2020-07-12 MED ORDER — SIMETHICONE 80 MG PO CHEW
80.0000 mg | CHEWABLE_TABLET | Freq: Three times a day (TID) | ORAL | Status: DC
  Administered 2020-07-12 – 2020-07-13 (×4): 80 mg via ORAL
  Filled 2020-07-12 (×4): qty 1

## 2020-07-12 MED ORDER — DIPHENHYDRAMINE HCL 25 MG PO CAPS
25.0000 mg | ORAL_CAPSULE | Freq: Four times a day (QID) | ORAL | Status: DC | PRN
Start: 2020-07-12 — End: 2020-07-14

## 2020-07-12 SURGICAL SUPPLY — 39 items
APL SKNCLS STERI-STRIP NONHPOA (GAUZE/BANDAGES/DRESSINGS) ×1
BENZOIN TINCTURE PRP APPL 2/3 (GAUZE/BANDAGES/DRESSINGS) ×1 IMPLANT
CHLORAPREP W/TINT 26ML (MISCELLANEOUS) ×2 IMPLANT
CLAMP CORD UMBIL (MISCELLANEOUS) IMPLANT
CLOTH BEACON ORANGE TIMEOUT ST (SAFETY) ×2 IMPLANT
DRAPE C SECTION CLR SCREEN (DRAPES) ×2 IMPLANT
DRSG OPSITE POSTOP 4X10 (GAUZE/BANDAGES/DRESSINGS) ×2 IMPLANT
ELECT REM PT RETURN 9FT ADLT (ELECTROSURGICAL) ×2
ELECTRODE REM PT RTRN 9FT ADLT (ELECTROSURGICAL) ×1 IMPLANT
EXTRACTOR VACUUM KIWI (MISCELLANEOUS) IMPLANT
GLOVE BIO SURGEON STRL SZ 6.5 (GLOVE) ×2 IMPLANT
GLOVE BIOGEL PI IND STRL 7.0 (GLOVE) ×2 IMPLANT
GLOVE BIOGEL PI INDICATOR 7.0 (GLOVE) ×2
GOWN STRL REUS W/TWL LRG LVL3 (GOWN DISPOSABLE) ×4 IMPLANT
KIT ABG SYR 3ML LUER SLIP (SYRINGE) IMPLANT
NDL HYPO 25X5/8 SAFETYGLIDE (NEEDLE) IMPLANT
NEEDLE HYPO 25X5/8 SAFETYGLIDE (NEEDLE) IMPLANT
NS IRRIG 1000ML POUR BTL (IV SOLUTION) ×2 IMPLANT
PACK C SECTION WH (CUSTOM PROCEDURE TRAY) ×2 IMPLANT
PAD ABD 8X7 1/2 STERILE (GAUZE/BANDAGES/DRESSINGS) ×1 IMPLANT
PAD OB MATERNITY 4.3X12.25 (PERSONAL CARE ITEMS) ×2 IMPLANT
RETRACTOR WND ALEXIS 25 LRG (MISCELLANEOUS) ×1 IMPLANT
RTRCTR C-SECT PINK 25CM LRG (MISCELLANEOUS) IMPLANT
RTRCTR WOUND ALEXIS 25CM LRG (MISCELLANEOUS) ×2
SPONGE GAUZE 4X4 FOR O.R. (GAUZE/BANDAGES/DRESSINGS) ×1 IMPLANT
STRIP CLOSURE SKIN 1/2X4 (GAUZE/BANDAGES/DRESSINGS) ×1 IMPLANT
SUT CHROMIC 1 CTX 36 (SUTURE) ×4 IMPLANT
SUT PLAIN 0 NONE (SUTURE) IMPLANT
SUT PLAIN 2 0 XLH (SUTURE) ×2 IMPLANT
SUT VIC AB 0 CT1 27 (SUTURE) ×4
SUT VIC AB 0 CT1 27XBRD ANBCTR (SUTURE) ×2 IMPLANT
SUT VIC AB 2-0 CT1 27 (SUTURE) ×2
SUT VIC AB 2-0 CT1 TAPERPNT 27 (SUTURE) ×1 IMPLANT
SUT VIC AB 3-0 CT1 27 (SUTURE)
SUT VIC AB 3-0 CT1 TAPERPNT 27 (SUTURE) IMPLANT
SUT VIC AB 4-0 KS 27 (SUTURE) ×2 IMPLANT
TOWEL OR 17X24 6PK STRL BLUE (TOWEL DISPOSABLE) ×2 IMPLANT
TRAY FOLEY W/BAG SLVR 14FR LF (SET/KITS/TRAYS/PACK) ×2 IMPLANT
WATER STERILE IRR 1000ML POUR (IV SOLUTION) ×2 IMPLANT

## 2020-07-12 NOTE — Progress Notes (Signed)
Patient ID: April Charles, female   DOB: April 24, 1990, 30 y.o.   MRN: [id number] Pt reports increasing pelvic pressure with contractions since pitocin increased to Denies fever or chills. +FMs VSS GEN - NAD EFM - 140, cat 1, early decels, occ variables, no late decels TOCO - ctxs q 1-40mins SVE - 7/100/0; IUPC in place mvus intermittently adequate  A/P: Progressing in labor on pitocin - increase till consistently adequate mvus         Continue with expectant mgmt

## 2020-07-12 NOTE — Transfer of Care (Signed)
Immediate Anesthesia Transfer of Care Note  Patient: April Charles  Procedure(s) Performed: CESAREAN SECTION (N/A )  Patient Location: PACU  Anesthesia Type:Epidural  Level of Consciousness: awake, alert , oriented and patient cooperative  Airway & Oxygen Therapy: Patient Spontanous Breathing  Post-op Assessment: Report given to RN and Post -op Vital signs reviewed and stable  Post vital signs: Reviewed and stable  Last Vitals:  Vitals Value Taken Time  BP 111/62 07/12/20 0811  Temp    Pulse 91 07/12/20 0813  Resp 19 07/12/20 0813  SpO2 94 % 07/12/20 0813  Vitals shown include unvalidated device data.  Last Pain:  Vitals:   07/12/20 0630  TempSrc:   PainSc: 9          Complications: No complications documented.

## 2020-07-12 NOTE — Progress Notes (Signed)
Patient ID: April Charles, female   DOB: March 26, 1991, 30 y.o.   MRN: [id number] Called and informed pt with increasing pelvic pain iin LLQ - unrelenting. Cervix unchanged despite adequate contractions.  I offered pt delivery via cesarean section and she agreed I reviewed the expectations during procedure and consent verified after risks/benefits discussed To OR now

## 2020-07-12 NOTE — Anesthesia Postprocedure Evaluation (Signed)
Anesthesia Post Note  Patient: Art therapist  Procedure(s) Performed: CESAREAN SECTION (N/A )     Patient location during evaluation: PACU Anesthesia Type: Epidural Level of consciousness: oriented, awake and alert and awake Pain management: pain level controlled Vital Signs Assessment: post-procedure vital signs reviewed and stable Respiratory status: spontaneous breathing, respiratory function stable and nonlabored ventilation Cardiovascular status: blood pressure returned to baseline and stable Postop Assessment: no headache, no backache, no apparent nausea or vomiting, patient able to bend at knees and epidural receding Anesthetic complications: no Comments: Epidural to C-section   No complications documented.  Last Vitals:  Vitals:   07/12/20 1245 07/12/20 1545  BP: 112/73   Pulse: 73   Resp: 16 16  Temp: 36.8 C   SpO2: 96% 97%    Last Pain:  Vitals:   07/12/20 1245  TempSrc: Oral  PainSc: 0-No pain   Pain Goal:                   April Charles

## 2020-07-12 NOTE — Progress Notes (Signed)
Patient ID: April Charles, female   DOB: [birth year], 30 y.o.   MRN: [id number] Pt comfortable with no complaints VSS EFM - cat 1, 140s TOCO - ctxs q 2-3 mins SVE - 6/90/-2  A/P: cervical changed noted - more effaced         IUPC placed         Titrate pitocin till adequate mvus

## 2020-07-12 NOTE — Op Note (Addendum)
Operative Note    Preoperative Diagnosis 1.IUP at 39 weeks 2. Arrest of dilation 3. GDMA2 4. Polyhydramnious   Postoperative Diagnosis: Same 5. Asynclictic lie    Procedure: Primary low transverse cesarean section with double layered closure   Surgeon: Britt Bottom DO Assist: Tyron Russell Civita    Anesthesia: Epidural  Fluids: LR EBL: UOP:245ml   Findings: Viable female infant in asynclictic vertex position, loose nuchal x 1 Apgars 6, 9. Weight pending. Grossly normal uterus tubes and ovaries   Specimen: Placenta to L/D   Procedure Note  Consent verified pre-op. All questions answered   Patient's epidural was re-dosed in her room prior to coming to OR. In the operating room her  anesthesia was tested and found to be adequate.  She was prepped and draped in the normal sterile fashion and placed in the dorsal supine position with a leftward tilt. An appropriate time out was performed. A Pfannenstiel skin incision was then made and carried through to the underlying layer of fascia by sharp dissection. The fascia was nicked in the midline and the incision was extended laterally with Mayo scissors. The superior and inferior aspects of the incision were grasped kocher clamps and dissected off the underlying rectus muscles.Rectus muscles were separated in the midline  and the peritoneal cavity entered bluntly. The peritoneal incision was then extended both superiorly and inferiorly with careful attention to avoid both bowel and bladder. The Alexis self-retaining wound retractor was then placed and the lower uterine segment exposed. The bladder flap was developed with Metzenbaum scissors and pushed away from the lower uterine segment. The lower uterine segment was then incised in a transverse fashion and the cavity itself entered bluntly. The infant's head was then lifted back into the uterine cavity and delivered without difficulty.  Loose nuchal was reduced over infants head.  Anterior and then posterior shoulders delivered next; body easily followed.  Although baby had good tone, ans color, no cry was initially noted. Attempt was made after bulb suction of nose and mouth. Without waiting for a minute delay, cord was clamped and cut and infant handed off to awaiting NICU team. Cord blood was obtained and then the placenta spontaneously expressed from the uterus.  The uterus was cleared of all clots and debris with moist lap sponge.  The uterine incision was then repaired in 2 layers the first layer was a running locked layer 1-0 chromic and the second an imbricating layer of the same suture. The tubes and ovaries were inspected and the gutters cleared of all clots and debris. The uterine incision was inspected and found to be hemostatic. All instruments and sponges were then removed from the abdomen. The peritoneum and rectus muscles were then reapproximated in a running  fashion with sutures of 2-0 Vicryl.  The fascia was then closed with 0 Vicryl in a running fashion. The skin was closed with a subcuticular stitch of 4-0 Vicryl on a Keith needle and then reinforced with benzoin and Steri-Strips.  At the conclusion of the procedure all instruments and sponge counts were correct. Patient was taken to the recovery room in good condition with her baby accompanying her skin to skin.

## 2020-07-13 LAB — CBC
HCT: 23.7 % — ABNORMAL LOW (ref 36.0–46.0)
Hemoglobin: 8 g/dL — ABNORMAL LOW (ref 12.0–15.0)
MCH: 28.1 pg (ref 26.0–34.0)
MCHC: 33.8 g/dL (ref 30.0–36.0)
MCV: 83.2 fL (ref 80.0–100.0)
Platelets: 127 10*3/uL — ABNORMAL LOW (ref 150–400)
RBC: 2.85 MIL/uL — ABNORMAL LOW (ref 3.87–5.11)
RDW: 13.7 % (ref 11.5–15.5)
WBC: 17.8 10*3/uL — ABNORMAL HIGH (ref 4.0–10.5)
nRBC: 0 % (ref 0.0–0.2)

## 2020-07-13 LAB — BIRTH TISSUE RECOVERY COLLECTION (PLACENTA DONATION)

## 2020-07-13 NOTE — Lactation Note (Addendum)
This note was copied from a baby's chart. Lactation Consultation Note  Patient Name: April Charles [id number]'N Date: 07/13/2020 Reason for consult: Initial assessment;Primapara;1st time breastfeeding;Term;Infant weight loss;Breast reduction;Other (Comment) (2 % weight loss , baby in the treatment nursery having a circ, LC encouraged mom to call for latch assessment and assist. mom eating breakfast) Age:31 hours  LC discussed potential post circ behavior.  Since the baby has not latched since birth , attempt , LC discussed the need to be set up with a DEBP to enhance the milk to come in.   Maternal Data    Feeding Mother's Current Feeding Choice: Breast Milk and Formula Nipple Type: Slow - flow  LATCH Score                    Lactation Tools Discussed/Used    Interventions    Discharge Pump: Personal;DEBP  Consult Status Consult Status: Follow-up Date: 07/13/20 Follow-up type: In-patient    [name] 07/13/2020, 10:29 AM

## 2020-07-13 NOTE — Progress Notes (Signed)
POSTPARTUM POSTOP PROGRESS NOTE  POD #1  Subjective:  No acute events overnight.  Pt denies problems with ambulating, voiding or po intake.  She denies nausea or vomiting.  Pain is well controlled.  She has not had flatus. She has not had bowel movement.  Lochia Minimal. Minimal ambuilation within room, Complaining of gas pain, reviewed ambulation to aid in this  Objective: Blood pressure 109/69, pulse 72, temperature 97.6 F (36.4 C), temperature source Oral, resp. rate 16, height 5' (1.524 m), weight 70.4 kg, SpO2 99 %.  Physical Exam:  General: alert, cooperative and no distress Lochia:normal flow Chest: CTAB Heart: RRR no m/r/g Abdomen: +BS, soft, nontender. Mild tympany epigastrically Uterine Fundus: firm, 2cm below umbilicus. Honeycomb dressing intact, neg drainage Extremities: neg edema, neg calf TTP BL, neg Homans BL  Recent Labs    07/11/20 1222 07/13/20 0502  HGB 10.1* 8.0*  HCT 29.9* 23.7*    Assessment/Plan:  ASSESSMENT: April Charles is a 30 y.o. G2P1011 s/p PLTCS @ [redacted]w[redacted]d for arrest of dilation. PNC c/b GDMA2 on metformin 500mg  QHS, polyhydramnios.   Plan for discharge tomorrow, Breastfeeding and Circumcision prior to discharge Circ this AM Gestational TCP on admission - stable this AM, likely recheck at postpartum visit  LOS: 2 days

## 2020-07-14 MED ORDER — OXYCODONE HCL 5 MG PO TABS: 5.0000 mg | ORAL_TABLET | ORAL | 0 refills | Status: DC | PRN

## 2020-07-14 MED ORDER — IBUPROFEN 800 MG PO TABS: 800.0000 mg | ORAL_TABLET | Freq: Three times a day (TID) | ORAL | 0 refills | Status: DC

## 2020-07-14 MED ORDER — MEASLES, MUMPS & RUBELLA VAC IJ SOLR
0.5000 mL | Freq: Once | INTRAMUSCULAR | Status: AC
  Administered 2020-07-14: 0.5 mL via SUBCUTANEOUS

## 2020-07-14 MED ORDER — ACETAMINOPHEN 500 MG PO TABS: 1000.0000 mg | ORAL_TABLET | Freq: Four times a day (QID) | ORAL | 0 refills | Status: DC

## 2020-07-14 NOTE — Progress Notes (Signed)
Subjective: Postpartum Day 2: Cesarean Delivery Patient reports tolerating PO, + flatus and no problems voiding.  Would like to go home today  Objective: Vital signs in last 24 hours: Temp:  [97.4 F (36.3 C)-98 F (36.7 C)] 98 F (36.7 C) (03/30 0500) Pulse Rate:  [80-93] 80 (03/30 0500) Resp:  [16-18] 16 (03/30 0500) BP: (101-112)/(61-73) 107/73 (03/30 0500) SpO2:  [98 %-100 %] 98 % (03/30 0500)  Physical Exam:  General: alert and cooperative Lochia: appropriate Uterine Fundus: firm Incision: C/D/I   Recent Labs    07/11/20 1222 07/13/20 0502  HGB 10.1* 8.0*  HCT 29.9* 23.7*    Assessment/Plan: Status post Cesarean section. Doing well postoperatively.  D/c home  Oliver Pila 07/14/2020, 9:35 AM

## 2020-07-14 NOTE — Discharge Summary (Signed)
Postpartum Discharge Summary     Patient Name: April Charles DOB: [birth year] MRN: [id number]  Date of admission: 07/11/2020 Delivery date:07/12/2020  Delivering provider: Pry[facility] Ochoa [facility]  Date of discharge: 07/14/2020  Admitting diagnosis: Gestational diabetes mellitus (GDM), antepartum, gestational diabetes method of control unspecified [O24.419] Intrauterine pregnancy: [redacted]w[redacted]d     Secondary diagnosis:  Active Problems:   Gestational diabetes mellitus (GDM), antepartum, gestational diabetes method of control unspecified   Status post primary low transverse cesarean section  Additional problems: none    Discharge diagnosis: Term Pregnancy Delivered and GDM A2                                              Post partum procedures:none Augmentation: AROM, Pitocin, Cytotec and IP Foley Complications: None  Hospital course: Induction of Labor With Cesarean Section   30 y.o. yo G2P0010 at [redacted]w[redacted]d was admitted to the hospital 07/11/2020 for induction of labor. Patient had a labor course significant for arrest of dilation. The patient went for cesarean section due to Arrest of Dilation. Delivery details are as follows: Membrane Rupture Time/Date: 2:17 PM ,07/11/2020   Delivery Method:C-Section, Low Transverse  Details of operation can be found in separate operative Note.  Patient had an uncomplicated postpartum course. She is ambulating, tolerating a regular diet, passing flatus, and urinating well.  Patient is discharged home in stable condition on 07/14/20.      Newborn Data: Birth date:07/12/2020  Birth time:7:30 AM  Gender:Female  Living status:Living  Apgars:6 ,9  Weight:3305 g                                 Magnesium Sulfate received: No BMZ received: No Rhophylac:No  Physical exam  Vitals:   07/13/20 0426 07/13/20 1406 07/13/20 2038 07/14/20 0500  BP: 109/69 101/69 112/61 107/73  Pulse: 72 84 93 80  Resp: 16 18 18 16   Temp: 97.6 F (36.4 C) (!) 97.4 F (36.3 C) 97.7  F (36.5 C) 98 F (36.7 C)  TempSrc: Oral Oral Oral Oral  SpO2: 99% 100% 100% 98%  Weight:      Height:       General: alert and cooperative Lochia: appropriate Uterine Fundus: firm Incision: Dressing is clean, dry, and intact DVT Evaluation: No evidence of DVT seen on physical exam. Labs: Lab Results  Component Value Date   WBC 17.8 (H) 07/13/2020   HGB 8.0 (L) 07/13/2020   HCT 23.7 (L) 07/13/2020   MCV 83.2 07/13/2020   PLT 127 (L) 07/13/2020   CMP Latest Ref Rng & Units 06/12/2020  Glucose 70 - 99 mg/dL 95  BUN 6 - 20 mg/dL 06/14/2020)  Creatinine <9(U - 1.00 mg/dL 3.83  Sodium 3.38 - 329 mmol/L 137  Potassium 3.5 - 5.1 mmol/L 3.5  Chloride 98 - 111 mmol/L 102  CO2 22 - 32 mmol/L 24  Calcium 8.9 - 10.3 mg/dL 191)  Total Protein 6.5 - 8.1 g/dL 6.0(L)  Total Bilirubin 0.3 - 1.2 mg/dL 0.8  Alkaline Phos 38 - 126 U/L 93  AST 15 - 41 U/L 20  ALT 0 - 44 U/L 13   Edinburgh Score: Edinburgh Postnatal Depression Scale Screening Tool 07/12/2020  I have been able to laugh and see the funny side of things. 0  I have looked  forward with enjoyment to things. 0  I have blamed myself unnecessarily when things went wrong. 1  I have been anxious or worried for no good reason. 1  I have felt scared or panicky for no good reason. 0  Things have been getting on top of me. 1  I have been so unhappy that I have had difficulty sleeping. 0  I have felt sad or miserable. 1  I have been so unhappy that I have been crying. 0  The thought of harming myself has occurred to me. 0  Edinburgh Postnatal Depression Scale Total 4     After visit meds:  Allergies as of 07/14/2020   No Known Allergies     Medication List    STOP taking these medications   calcium carbonate 500 MG chewable tablet Commonly known as: TUMS - dosed in mg elemental calcium   metFORMIN 500 MG tablet Commonly known as: GLUCOPHAGE     TAKE these medications   acetaminophen 500 MG tablet Commonly known as:  TYLENOL Take 2 tablets (1,000 mg total) by mouth every 6 (six) hours.   ibuprofen 800 MG tablet Commonly known as: ADVIL Take 1 tablet (800 mg total) by mouth every 8 (eight) hours.   oxyCODONE 5 MG immediate release tablet Commonly known as: Oxy IR/ROXICODONE Take 1-2 tablets (5-10 mg total) by mouth every 4 (four) hours as needed for moderate pain.   prenatal multivitamin Tabs tablet Take 1 tablet by mouth daily at 12 noon.        Discharge home in stable condition Infant Feeding: Bottle and Breast Infant Disposition:home with mother Discharge instruction: per After Visit Summary and Postpartum booklet. Activity: Advance as tolerated. Pelvic rest for 6 weeks.  Diet: routine diet Future Appointments:No future appointments. Follow up Visit:  Follow-up Information    Edwinna Areola, DO. Schedule an appointment as soon as possible for a visit in 2 week(s).   Specialty: Obstetrics and Gynecology Why: incision check Contact information: [address] [place] [address] [phone]                Please schedule this patient for a In person postpartum visit in 2 weeks with the following provider: MD.  Delivery mode:  C-Section, Low Transverse  Anticipated Birth Control:  Unsure   07/14/2020 Oliver Pila, MD

## 2020-07-14 NOTE — Lactation Note (Signed)
This note was copied from a baby's chart. Lactation Consultation Note  Patient Name: April Charles HUTML'Y Date: 07/14/2020 Reason for consult: Follow-up assessment;Primapara;1st time breastfeeding;Term;Infant weight loss;Other (Comment) (per mom plans to work on breastfeeding when she  goes home and has a DEBP . breast are filling heavier today. LC reassured mom that is a good sign. see LC note) Age:31 hours  LC recommended and encouraged mom since her milk is coming in to offer the breast. May need to 1st give the baby an appetizer of EBM or formula and then latch.  LC also recommended considering calling back for [facility] O/P appt . Next week when her milk is in, but keep working on latching. See the education below.  Mom has the [facility] brochure with resources.   Maternal Data Does the patient have breastfeeding experience prior to this delivery?: No  Feeding Mother's Current Feeding Choice: Breast Milk and Formula Nipple Type: Slow - flow  LATCH Score                    Lactation Tools Discussed/Used    Interventions Interventions: Breast feeding basics reviewed  Discharge Discharge Education: Engorgement and breast care;Warning signs for feeding baby Pump: Personal;Manual;DEBP  Consult Status Consult Status: Complete Date: 07/14/20    Kathrin Greathouse 07/14/2020, 1:50 PM

## 2020-07-29 DIAGNOSIS — Z8632 Personal history of gestational diabetes: Secondary | ICD-10-CM | POA: Diagnosis not present

## 2020-08-02 DIAGNOSIS — R1083 Colic: Secondary | ICD-10-CM | POA: Diagnosis not present

## 2020-08-02 DIAGNOSIS — Z139 Encounter for screening, unspecified: Secondary | ICD-10-CM | POA: Diagnosis not present

## 2020-08-02 DIAGNOSIS — Z00111 Health examination for newborn 8 to 28 days old: Secondary | ICD-10-CM | POA: Diagnosis not present

## 2020-08-02 DIAGNOSIS — Z713 Dietary counseling and surveillance: Secondary | ICD-10-CM | POA: Diagnosis not present

## 2020-08-25 DIAGNOSIS — Z3009 Encounter for other general counseling and advice on contraception: Secondary | ICD-10-CM | POA: Diagnosis not present

## 2020-08-25 DIAGNOSIS — Z1389 Encounter for screening for other disorder: Secondary | ICD-10-CM | POA: Diagnosis not present

## 2020-10-25 ENCOUNTER — Other Ambulatory Visit: Payer: Self-pay | Admitting: Physician Assistant

## 2020-10-25 DIAGNOSIS — R1013 Epigastric pain: Secondary | ICD-10-CM

## 2020-10-25 DIAGNOSIS — D649 Anemia, unspecified: Secondary | ICD-10-CM | POA: Diagnosis not present

## 2020-10-25 DIAGNOSIS — R1011 Right upper quadrant pain: Secondary | ICD-10-CM | POA: Diagnosis not present

## 2020-11-22 ENCOUNTER — Ambulatory Visit
Admission: RE | Admit: 2020-11-22 | Discharge: 2020-11-22 | Disposition: A | Discharge status: Home / Self Care | Payer: BC Managed Care – PPO | Source: Ambulatory Visit | Attending: Physician Assistant | Admitting: Physician Assistant

## 2020-11-22 DIAGNOSIS — R1013 Epigastric pain: Secondary | ICD-10-CM

## 2020-11-22 DIAGNOSIS — K8020 Calculus of gallbladder without cholecystitis without obstruction: Secondary | ICD-10-CM | POA: Diagnosis not present

## 2020-11-22 DIAGNOSIS — K760 Fatty (change of) liver, not elsewhere classified: Secondary | ICD-10-CM | POA: Diagnosis not present

## 2020-11-22 DIAGNOSIS — R1011 Right upper quadrant pain: Secondary | ICD-10-CM

## 2021-01-28 DIAGNOSIS — Z124 Encounter for screening for malignant neoplasm of cervix: Secondary | ICD-10-CM | POA: Diagnosis not present

## 2021-01-28 DIAGNOSIS — Z130 Encounter for screening for diseases of the blood and blood-forming organs and certain disorders involving the immune mechanism: Secondary | ICD-10-CM | POA: Diagnosis not present

## 2021-01-28 DIAGNOSIS — K219 Gastro-esophageal reflux disease without esophagitis: Secondary | ICD-10-CM | POA: Diagnosis not present

## 2021-01-28 DIAGNOSIS — Z202 Contact with and (suspected) exposure to infections with a predominantly sexual mode of transmission: Secondary | ICD-10-CM | POA: Diagnosis not present

## 2021-01-28 DIAGNOSIS — Z01411 Encounter for gynecological examination (general) (routine) with abnormal findings: Secondary | ICD-10-CM | POA: Diagnosis not present

## 2021-01-28 DIAGNOSIS — D509 Iron deficiency anemia, unspecified: Secondary | ICD-10-CM | POA: Diagnosis not present

## 2021-01-28 DIAGNOSIS — K8020 Calculus of gallbladder without cholecystitis without obstruction: Secondary | ICD-10-CM | POA: Diagnosis not present

## 2021-01-28 DIAGNOSIS — N926 Irregular menstruation, unspecified: Secondary | ICD-10-CM | POA: Diagnosis not present

## 2021-01-28 DIAGNOSIS — E669 Obesity, unspecified: Secondary | ICD-10-CM | POA: Diagnosis not present

## 2021-02-09 DIAGNOSIS — D225 Melanocytic nevi of trunk: Secondary | ICD-10-CM | POA: Diagnosis not present

## 2021-02-09 DIAGNOSIS — L858 Other specified epidermal thickening: Secondary | ICD-10-CM | POA: Diagnosis not present

## 2021-02-09 DIAGNOSIS — D2271 Melanocytic nevi of right lower limb, including hip: Secondary | ICD-10-CM | POA: Diagnosis not present

## 2021-04-12 DIAGNOSIS — N870 Mild cervical dysplasia: Secondary | ICD-10-CM | POA: Diagnosis not present

## 2021-04-12 DIAGNOSIS — R87610 Atypical squamous cells of undetermined significance on cytologic smear of cervix (ASC-US): Secondary | ICD-10-CM | POA: Diagnosis not present

## 2021-04-12 DIAGNOSIS — D2262 Melanocytic nevi of left upper limb, including shoulder: Secondary | ICD-10-CM | POA: Diagnosis not present

## 2021-04-12 DIAGNOSIS — D225 Melanocytic nevi of trunk: Secondary | ICD-10-CM | POA: Diagnosis not present

## 2021-07-28 DIAGNOSIS — I10 Essential (primary) hypertension: Secondary | ICD-10-CM | POA: Diagnosis not present

## 2021-07-28 DIAGNOSIS — J040 Acute laryngitis: Secondary | ICD-10-CM | POA: Diagnosis not present

## 2021-07-28 DIAGNOSIS — Z6825 Body mass index (BMI) 25.0-25.9, adult: Secondary | ICD-10-CM | POA: Diagnosis not present

## 2021-07-28 DIAGNOSIS — Z20828 Contact with and (suspected) exposure to other viral communicable diseases: Secondary | ICD-10-CM | POA: Diagnosis not present

## 2021-07-28 DIAGNOSIS — J069 Acute upper respiratory infection, unspecified: Secondary | ICD-10-CM | POA: Diagnosis not present

## 2021-07-28 DIAGNOSIS — R059 Cough, unspecified: Secondary | ICD-10-CM | POA: Diagnosis not present

## 2021-10-12 ENCOUNTER — Inpatient Hospital Stay (HOSPITAL_COMMUNITY)
Admission: AD | Admit: 2021-10-12 | Discharge: 2021-10-13 | Disposition: A | Discharge status: Home / Self Care | Payer: BC Managed Care – PPO | Attending: Obstetrics | Admitting: Obstetrics

## 2021-10-12 ENCOUNTER — Encounter (HOSPITAL_COMMUNITY): Payer: Self-pay

## 2021-10-12 ENCOUNTER — Inpatient Hospital Stay (HOSPITAL_COMMUNITY): Payer: BC Managed Care – PPO

## 2021-10-12 DIAGNOSIS — R109 Unspecified abdominal pain: Secondary | ICD-10-CM | POA: Insufficient documentation

## 2021-10-12 DIAGNOSIS — O26891 Other specified pregnancy related conditions, first trimester: Secondary | ICD-10-CM | POA: Diagnosis not present

## 2021-10-12 DIAGNOSIS — Z3A01 Less than 8 weeks gestation of pregnancy: Secondary | ICD-10-CM | POA: Diagnosis not present

## 2021-10-12 DIAGNOSIS — O00101 Right tubal pregnancy without intrauterine pregnancy: Secondary | ICD-10-CM | POA: Diagnosis not present

## 2021-10-12 DIAGNOSIS — Z3A00 Weeks of gestation of pregnancy not specified: Secondary | ICD-10-CM | POA: Diagnosis not present

## 2021-10-12 LAB — COMPREHENSIVE METABOLIC PANEL
ALT: 15 U/L (ref 0–44)
AST: 15 U/L (ref 15–41)
Albumin: 4 g/dL (ref 3.5–5.0)
Alkaline Phosphatase: 48 U/L (ref 38–126)
Anion gap: 8 (ref 5–15)
BUN: 12 mg/dL (ref 6–20)
CO2: 25 mmol/L (ref 22–32)
Calcium: 9.5 mg/dL (ref 8.9–10.3)
Chloride: 106 mmol/L (ref 98–111)
Creatinine, Ser: 0.68 mg/dL (ref 0.44–1.00)
GFR, Estimated: >60 mL/min (ref >60)
Glucose, Bld: 99 mg/dL (ref 70–99)
Potassium: 3.9 mmol/L (ref 3.5–5.1)
Sodium: 139 mmol/L (ref 135–145)
Total Bilirubin: 0.7 mg/dL (ref 0.3–1.2)
Total Protein: 6.4 g/dL — ABNORMAL LOW (ref 6.5–8.1)

## 2021-10-12 LAB — URINALYSIS, ROUTINE W REFLEX MICROSCOPIC
Bacteria, UA: NONE SEEN
Bilirubin Urine: NEGATIVE
Glucose, UA: NEGATIVE mg/dL
Ketones, ur: NEGATIVE mg/dL
Leukocytes,Ua: NEGATIVE
Nitrite: NEGATIVE
Protein, ur: NEGATIVE mg/dL
Specific Gravity, Urine: 1.02 (ref 1.005–1.030)
pH: 6 (ref 5.0–8.0)

## 2021-10-12 LAB — POCT PREGNANCY, URINE: Preg Test, Ur: POSITIVE — AB

## 2021-10-12 LAB — CBC
HCT: 38.1 % (ref 36.0–46.0)
Hemoglobin: 12.6 g/dL (ref 12.0–15.0)
MCH: 27.8 pg (ref 26.0–34.0)
MCHC: 33.1 g/dL (ref 30.0–36.0)
MCV: 83.9 fL (ref 80.0–100.0)
Platelets: 226 10*3/uL (ref 150–400)
RBC: 4.54 MIL/uL (ref 3.87–5.11)
RDW: 12.7 % (ref 11.5–15.5)
WBC: 8.8 10*3/uL (ref 4.0–10.5)
nRBC: 0 % (ref 0.0–0.2)

## 2021-10-12 LAB — HCG, QUANTITATIVE, PREGNANCY: hCG, Beta Chain, Quant, S: 2290 m[IU]/mL — ABNORMAL HIGH (ref <5)

## 2021-10-12 MED ORDER — METHOTREXATE FOR ECTOPIC PREGNANCY
50.0000 mg/m2 | Freq: Once | INTRAMUSCULAR | Status: AC
  Administered 2021-10-12: 77.5 mg via INTRAMUSCULAR
  Filled 2021-10-12: qty 10

## 2021-10-12 NOTE — MAU Note (Addendum)
.  April Charles is a 31 y.o. at Unknown here in MAU reporting: since 1830, that lasted for about 30-45 mins, pt states she had a sharp pulsing pain on her lower ABD right side , " near her ovaries". Pt stated it caused her to hit the floor it was so sudden and intense. Pt is currently not feeling the pain, but has been having an intermittent dull ache in the same area since. Pt reports she had some dark red bleeding with wiping, and after the pain occurred she had some bright red blood with wiping. Pt is wearing a pad. Pt denies cramping, abnormal discharge and LOF. Last intercourse was last night.  LMP: 09/26/2021 Onset of complaint: 1830 Pain score: 1/10 Vitals:   10/12/21 2127  BP: 131/79  Pulse: 76  Temp: 97.9 F (36.6 C)  SpO2: 97%      Lab orders placed from triage:  UA

## 2021-10-12 NOTE — MAU Provider Note (Signed)
History     CSN: [id number]  Arrival date and time: 10/12/21 2024   Event Date/Time   First Provider Initiated Contact with Patient 10/12/21 2137      Chief Complaint  Patient presents with   Abdominal Pain   HPI  April Charles is a 31 y.o. G3P1011 at Unknown gestational age who presents for evaluation of abdominal pain. Patient reports she had an episode of sharp pain of the right side near her pelvis. When it happened, she rated the pain a 10/10 and fell to the floor. She reports now her pain is just a dull ache that she would rate a 1/10. She reports she has irregular periods and just had a period that ended last week. She did not suspect that she was pregnant before her arrival. She denies any vaginal bleeding and discharge. Denies any constipation, diarrhea or any urinary complaints.  OB History     Gravida  3   Para  1   Term  1   Preterm      AB  1   Living  1      SAB      IAB      Ectopic  1   Multiple      Live Births  1           Past Medical History:  Diagnosis Date   Heart murmur    Hx as a child, no problems as an adult   IBS (irritable bowel syndrome)    no meds   Kidney stone     Past Surgical History:  Procedure Laterality Date   CESAREAN SECTION N/A 07/12/2020   Procedure: CESAREAN SECTION;  Surgeon: Edwinna Areola, DO;  Location: [facility];  Service: Obstetrics;  Laterality: N/A;   LAPAROSCOPY N/A 12/10/2015   Procedure: Operative Laparoscopy, Left Salpingectomy;  Surgeon: Edwinna Areola, DO;  Location: [facility];  Service: Gynecology;  Laterality: N/A;    History reviewed. No pertinent family history.  Social History   Tobacco Use   Smoking status: Never   Smokeless tobacco: Never  Vaping Use   Vaping Use: Never used  Substance Use Topics   Alcohol use: No   Drug use: No    Allergies: No Known Allergies  Medications Prior to Admission  Medication Sig Dispense Refill Last Dose   acetaminophen (TYLENOL)  500 MG tablet Take 2 tablets (1,000 mg total) by mouth every 6 (six) hours. 30 tablet 0    ibuprofen (ADVIL) 800 MG tablet Take 1 tablet (800 mg total) by mouth every 8 (eight) hours. 30 tablet 0    oxyCODONE (OXY IR/ROXICODONE) 5 MG immediate release tablet Take 1-2 tablets (5-10 mg total) by mouth every 4 (four) hours as needed for moderate pain. 15 tablet 0    Prenatal Vit-Fe Fumarate-FA (PRENATAL MULTIVITAMIN) TABS tablet Take 1 tablet by mouth daily at 12 noon.       Review of Systems  Constitutional: Negative.  Negative for fatigue and fever.  HENT: Negative.    Respiratory: Negative.  Negative for shortness of breath.   Cardiovascular: Negative.  Negative for chest pain.  Gastrointestinal:  Positive for abdominal pain. Negative for constipation, diarrhea, nausea and vomiting.  Genitourinary: Negative.  Negative for dysuria, vaginal bleeding and vaginal discharge.  Neurological: Negative.  Negative for dizziness and headaches.   Physical Exam   Blood pressure 131/79, pulse 76, temperature 97.9 F (36.6 C), temperature source Oral, height 5' (1.524 m), weight 58.2 kg,  last menstrual period 09/26/2021, SpO2 97 %, unknown if currently breastfeeding.  Patient Vitals for the past 24 hrs:  BP Temp Temp src Pulse SpO2 Height Weight  10/12/21 2127 131/79 97.9 F (36.6 C) Oral 76 97 % 5' (1.524 m) 58.2 kg    Physical Exam Vitals and nursing note reviewed.  Constitutional:      General: She is not in acute distress.    Appearance: She is well-developed.  HENT:     Head: Normocephalic.  Eyes:     Pupils: Pupils are equal, round, and reactive to light.  Cardiovascular:     Rate and Rhythm: Normal rate and regular rhythm.     Heart sounds: Normal heart sounds.  Pulmonary:     Effort: Pulmonary effort is normal. No respiratory distress.     Breath sounds: Normal breath sounds.  Abdominal:     General: Bowel sounds are normal. There is no distension.     Palpations: Abdomen is soft.      Tenderness: There is no abdominal tenderness.  Skin:    General: Skin is warm and dry.  Neurological:     Mental Status: She is alert and oriented to person, place, and time.  Psychiatric:        Mood and Affect: Mood normal.        Behavior: Behavior normal.        Thought Content: Thought content normal.        Judgment: Judgment normal.     MAU Course  Procedures  Results for orders placed or performed during the hospital encounter of 10/12/21 (from the past 24 hour(s))  Pregnancy, urine POC     Status: Abnormal   Collection Time: 10/12/21  8:42 PM  Result Value Ref Range   Preg Test, Ur POSITIVE (A) NEGATIVE  Urinalysis, Routine w reflex microscopic     Status: Abnormal   Collection Time: 10/12/21  8:53 PM  Result Value Ref Range   Color, Urine YELLOW YELLOW   APPearance HAZY (A) CLEAR   Specific Gravity, Urine 1.020 1.005 - 1.030   pH 6.0 5.0 - 8.0   Glucose, UA NEGATIVE NEGATIVE mg/dL   Hgb urine dipstick LARGE (A) NEGATIVE   Bilirubin Urine NEGATIVE NEGATIVE   Ketones, ur NEGATIVE NEGATIVE mg/dL   Protein, ur NEGATIVE NEGATIVE mg/dL   Nitrite NEGATIVE NEGATIVE   Leukocytes,Ua NEGATIVE NEGATIVE   RBC / HPF 11-20 0 - 5 RBC/hpf   WBC, UA 0-5 0 - 5 WBC/hpf   Bacteria, UA NONE SEEN NONE SEEN   Squamous Epithelial / LPF 0-5 0 - 5   Ca Oxalate Crys, UA PRESENT   CBC     Status: None   Collection Time: 10/12/21  9:08 PM  Result Value Ref Range   WBC 8.8 4.0 - 10.5 K/uL   RBC 4.54 3.87 - 5.11 MIL/uL   Hemoglobin 12.6 12.0 - 15.0 g/dL   HCT 16.1 09.6 - 04.5 %   MCV 83.9 80.0 - 100.0 fL   MCH 27.8 26.0 - 34.0 pg   MCHC 33.1 30.0 - 36.0 g/dL   RDW 40.9 81.1 - 91.4 %   Platelets 226 150 - 400 K/uL   nRBC 0.0 0.0 - 0.2 %  hCG, quantitative, pregnancy     Status: Abnormal   Collection Time: 10/12/21  9:08 PM  Result Value Ref Range   hCG, Beta Chain, Quant, S 2,290 (H) <5 mIU/mL  Comprehensive metabolic panel     Status: Abnormal  Collection Time: 10/12/21   9:08 PM  Result Value Ref Range   Sodium 139 135 - 145 mmol/L   Potassium 3.9 3.5 - 5.1 mmol/L   Chloride 106 98 - 111 mmol/L   CO2 25 22 - 32 mmol/L   Glucose, Bld 99 70 - 99 mg/dL   BUN 12 6 - 20 mg/dL   Creatinine, Ser 4.54 0.44 - 1.00 mg/dL   Calcium 9.5 8.9 - 09.8 mg/dL   Total Protein 6.4 (L) 6.5 - 8.1 g/dL   Albumin 4.0 3.5 - 5.0 g/dL   AST 15 15 - 41 U/L   ALT 15 0 - 44 U/L   Alkaline Phosphatase 48 38 - 126 U/L   Total Bilirubin 0.7 0.3 - 1.2 mg/dL   GFR, Estimated >11 >91 mL/min   Anion gap 8 5 - 15     US OB LESS THAN 14 WEEKS WITH OB TRANSVAGINAL  Result Date: 10/12/2021 CLINICAL DATA:  Right-sided pelvic pain EXAM: OBSTETRIC <14 WK [place] AND TRANSVAGINAL OB US TECHNIQUE: Both transabdominal and transvaginal ultrasound examinations were performed for complete evaluation of the gestation as well as the maternal uterus, adnexal regions, and pelvic cul-de-sac. Transvaginal technique was performed to assess early pregnancy. COMPARISON:  None Available. FINDINGS: Intrauterine gestational sac: None visualized Yolk sac:  Not seen Embryo:  Not seen Cardiac Activity: Not seen Maternal uterus/adnexae: Right ovary measures 3.5 x 2.3 x 1.9 cm. Left ovary measures 3.4 x 1.9 x 3 cm. Within the right adnexa, separate from the ovary is a soft tissue echogenicity with central fluid echogenicity, this measures 2.4 by 1 by 1.6 cm and is suspect for an ectopic pregnancy. Small volume anechoic fluid in the pelvis IMPRESSION: No IUP identified. Right adnexal 2.4 cm oval soft tissue echogenicity separate from the right ovary and suspect for a right ectopic pregnancy. Small volume anechoic free fluid in the pelvis. Critical Value/emergent results were called by telephone at the time of interpretation on 10/12/2021 at 10:30 pm to provider Mela Perham , who verbally acknowledged these results. Electronically Signed   By: Jasmine Pang M.D.   On: 10/12/2021 22:30     MDM Labs ordered and reviewed.   UA,  UPT CBC, HCG ABO/Rh- O Pos Wet prep and gc/chlamydia- declined US OB Comp Less 14 weeks with Transvaginal  CNM independently reviewed the imaging ordered. Imaging show no IUP and mass near right ovary  CNM consulted with Dr. Macon Large regarding presentation and results- MD recommends MTX therapy with strict precautions  Results reviewed at length with patient. Patient verbalized understanding and plan for lab monitoring. Patient requesting CNM speak with MD regarding outpatient labs instead of in MAU due to ER bills on insurance.   Dr. Chestine Spore notified of patient arrival, complaint and results- agreeable to plan of care. MD states patient has to come to MAU for day 4 labs but can return to the office on morning of day 8 for labs.   The risks of methotrexate were reviewed including failure requiring repeat dosing or eventual surgery. She understands that methotrexate involves frequent return visits to monitor lab values and that she remains at risk of ectopic rupture until her beta is less than assay. ?The patient opts to proceed with methotrexate.  She has no history of hepatic or renal dysfunction, has normal BUN/Cr/LFT's/platelets.  She is felt to be reliable for follow-up. Side effects of photosensitivity & GI upset were discussed.  She knows to avoid direct sunlight and abstain from alcohol, NSAIDs  and sexual intercourse for two weeks. She was counseled to discontinue any MVI with folic acid. ?She understands to follow up on D4 (7/1) and D8 per MD (7/5) for repeat BHCG and was given the instruction sheet. ?Strict ectopic precautions were reviewed, the patient knows to call with any abdominal pain, vomiting, fainting, or any concerns with her health.   MTX  Assessment and Plan   1. Right tubal pregnancy without intrauterine pregnancy   2. [redacted] weeks gestation of pregnancy     -Discharge home in stable condition -Strict ectopic precautions discussed -Patient advised to follow-up with MAU on  7/1 for repeat labs -Patient may return to MAU as needed or if her condition were to change or worsen  Rolm Bookbinder, CNM 10/12/2021, 10:54 PM

## 2021-10-15 ENCOUNTER — Inpatient Hospital Stay (HOSPITAL_COMMUNITY): Payer: BC Managed Care – PPO

## 2021-10-15 ENCOUNTER — Inpatient Hospital Stay (HOSPITAL_COMMUNITY)
Admission: AD | Admit: 2021-10-15 | Discharge: 2021-10-15 | Disposition: A | Discharge status: Home / Self Care | Payer: BC Managed Care – PPO | Attending: Obstetrics and Gynecology | Admitting: Obstetrics and Gynecology

## 2021-10-15 DIAGNOSIS — O00101 Right tubal pregnancy without intrauterine pregnancy: Secondary | ICD-10-CM | POA: Diagnosis not present

## 2021-10-15 DIAGNOSIS — O0889 Other complications following an ectopic and molar pregnancy: Secondary | ICD-10-CM | POA: Diagnosis not present

## 2021-10-15 LAB — HCG, QUANTITATIVE, PREGNANCY: hCG, Beta Chain, Quant, S: 3308 m[IU]/mL — ABNORMAL HIGH (ref <5)

## 2021-10-15 NOTE — MAU Note (Signed)
...  April Charles is a 31 y.o. at Unknown here in MAU reporting: Here for repeat beta hCG. She reports she is still experiencing bleeding but states it is less. She reports she wears the same pad throughout her work day, which is 8 hours, and the pad only has a small amount of blood on it. She reports she saw one pepper flake sized blood clot this morning. Endorses feeling right lower abdominal cramping that is dull and she rates it a 2. She reports this pain is better than what she was previously experiencing.   Pain score: 2/10 right lower abdomen

## 2021-10-15 NOTE — MAU Provider Note (Signed)
History   Chief Complaint:  repeat beta hCG   April Charles is  30 y.o. G3P1011 Patient's last menstrual period was 09/26/2021 (exact date).. Patient is here for follow up of quantitative HCG and ongoing surveillance of pregnancy status. She has a known right ectopic that was treated with MTX on 6/28     Since her last visit, the patient is without new complaint. The patient reports bleeding as  spotting.  She denies any pain.   General ROS:  negative   Her previous Quantitative HCG values are:  6/28: 2290 MTX   Physical Exam   Blood pressure 123/81, pulse (!) 103, temperature 98.7 F (37.1 C), temperature source Oral, resp. rate 15, height 5' (1.524 m), weight 57.9 kg, last menstrual period 09/26/2021, SpO2 98 %, unknown if currently breastfeeding.   Physical Exam Vitals and nursing note reviewed.  Constitutional:      General: She is not in acute distress.    Appearance: She is well-developed.  HENT:     Head: Normocephalic.  Eyes:     Pupils: Pupils are equal, round, and reactive to light.  Cardiovascular:     Rate and Rhythm: Normal rate and regular rhythm.  Pulmonary:     Effort: Pulmonary effort is normal. No respiratory distress.     Breath sounds: Normal breath sounds.  Abdominal:     Palpations: Abdomen is soft.     Tenderness: There is no abdominal tenderness.  Musculoskeletal:        General: Normal range of motion.     Cervical back: Normal range of motion.  Skin:    General: Skin is warm and dry.  Neurological:     Mental Status: She is alert and oriented to person, place, and time.  Psychiatric:        Behavior: Behavior normal.        Thought Content: Thought content normal.        Judgment: Judgment normal.       Labs: Results for orders placed or performed during the hospital encounter of 10/15/21 (from the past 24 hour(s))  hCG, quantitative, pregnancy   Collection Time: 10/15/21 11:27 AM  Result Value Ref Range   hCG, Beta Chain,  Quant, S 3,308 (H) <5 mIU/mL     Assessment:   1. Right tubal pregnancy without intrauterine pregnancy     -Reviewed results with Dr. Vergie Living- ok to discharge home and follow up as scheduled in office this week for day 7 labs  Plan: -Discharge home in stable condition -Strict ectopic precautions discussed -Patient advised to follow-up with OB as scheduled for day 7 labs -Patient may return to MAU as needed or if her condition were to change or worsen  Rolm Bookbinder, CNM 10/15/2021, 3:57 PM

## 2021-10-19 DIAGNOSIS — O009 Unspecified ectopic pregnancy without intrauterine pregnancy: Secondary | ICD-10-CM | POA: Diagnosis not present

## 2021-10-21 ENCOUNTER — Inpatient Hospital Stay (HOSPITAL_COMMUNITY)
Admission: AD | Admit: 2021-10-21 | Discharge: 2021-10-21 | Disposition: A | Discharge status: Home / Self Care | Payer: BC Managed Care – PPO | Attending: Obstetrics and Gynecology | Admitting: Obstetrics and Gynecology

## 2021-10-21 ENCOUNTER — Other Ambulatory Visit: Payer: Self-pay

## 2021-10-21 ENCOUNTER — Inpatient Hospital Stay (HOSPITAL_COMMUNITY): Payer: BC Managed Care – PPO | Admitting: Anesthesiology

## 2021-10-21 ENCOUNTER — Inpatient Hospital Stay (HOSPITAL_COMMUNITY): Payer: BC Managed Care – PPO

## 2021-10-21 ENCOUNTER — Encounter (HOSPITAL_COMMUNITY)
Admission: AD | Disposition: A | Discharge status: Home / Self Care | Payer: Self-pay | Source: Home / Self Care | Attending: Obstetrics and Gynecology

## 2021-10-21 ENCOUNTER — Encounter (HOSPITAL_COMMUNITY): Payer: Self-pay | Admitting: Obstetrics and Gynecology

## 2021-10-21 DIAGNOSIS — O009 Unspecified ectopic pregnancy without intrauterine pregnancy: Secondary | ICD-10-CM | POA: Diagnosis not present

## 2021-10-21 DIAGNOSIS — Z3A00 Weeks of gestation of pregnancy not specified: Secondary | ICD-10-CM | POA: Diagnosis not present

## 2021-10-21 DIAGNOSIS — O00101 Right tubal pregnancy without intrauterine pregnancy: Secondary | ICD-10-CM | POA: Insufficient documentation

## 2021-10-21 DIAGNOSIS — K661 Hemoperitoneum: Secondary | ICD-10-CM | POA: Diagnosis not present

## 2021-10-21 HISTORY — PX: LAPAROSCOPY: SHX197

## 2021-10-21 LAB — URINALYSIS, ROUTINE W REFLEX MICROSCOPIC
Bacteria, UA: NONE SEEN
Bilirubin Urine: NEGATIVE
Glucose, UA: NEGATIVE mg/dL
Ketones, ur: 5 mg/dL — AB
Leukocytes,Ua: NEGATIVE
Nitrite: NEGATIVE
Protein, ur: NEGATIVE mg/dL
Specific Gravity, Urine: 1.024 (ref 1.005–1.030)
pH: 5 (ref 5.0–8.0)

## 2021-10-21 LAB — CBC
HCT: 38.9 % (ref 36.0–46.0)
Hemoglobin: 13.2 g/dL (ref 12.0–15.0)
MCH: 28 pg (ref 26.0–34.0)
MCHC: 33.9 g/dL (ref 30.0–36.0)
MCV: 82.6 fL (ref 80.0–100.0)
Platelets: 221 10*3/uL (ref 150–400)
RBC: 4.71 MIL/uL (ref 3.87–5.11)
RDW: 12.6 % (ref 11.5–15.5)
WBC: 9.6 10*3/uL (ref 4.0–10.5)
nRBC: 0 % (ref 0.0–0.2)

## 2021-10-21 LAB — HCG, QUANTITATIVE, PREGNANCY: hCG, Beta Chain, Quant, S: 1231 m[IU]/mL — ABNORMAL HIGH (ref <5)

## 2021-10-21 SURGERY — LAPAROSCOPY OPERATIVE
Anesthesia: General | Laterality: Right

## 2021-10-21 MED ORDER — BUPIVACAINE HCL (PF) 0.25 % IJ SOLN
INTRAMUSCULAR | Status: AC
Start: 2021-10-21 — End: ?
  Filled 2021-10-21: qty 20

## 2021-10-21 MED ORDER — HYDROCODONE-ACETAMINOPHEN 5-325 MG PO TABS: 1.0000 | ORAL_TABLET | Freq: Four times a day (QID) | ORAL | 0 refills | Status: DC | PRN

## 2021-10-21 MED ORDER — ONDANSETRON HCL 4 MG/2ML IJ SOLN: 4.0000 mg | Freq: Once | INTRAMUSCULAR | Status: DC | PRN

## 2021-10-21 MED ORDER — MIDAZOLAM HCL 2 MG/2ML IJ SOLN
INTRAMUSCULAR | Status: AC
  Filled 2021-10-21: qty 2

## 2021-10-21 MED ORDER — ROCURONIUM BROMIDE 10 MG/ML (PF) SYRINGE
PREFILLED_SYRINGE | INTRAVENOUS | Status: DC | PRN
  Administered 2021-10-21: 40 mg via INTRAVENOUS

## 2021-10-21 MED ORDER — LACTATED RINGERS IV SOLN: INTRAVENOUS | Status: DC

## 2021-10-21 MED ORDER — LIDOCAINE 2% (20 MG/ML) 5 ML SYRINGE
INTRAMUSCULAR | Status: AC
Start: 2021-10-21 — End: ?
  Filled 2021-10-21: qty 5

## 2021-10-21 MED ORDER — ROCURONIUM BROMIDE 10 MG/ML (PF) SYRINGE
PREFILLED_SYRINGE | INTRAVENOUS | Status: AC
  Filled 2021-10-21: qty 10

## 2021-10-21 MED ORDER — OXYCODONE HCL 5 MG/5ML PO SOLN: 5.0000 mg | Freq: Once | ORAL | Status: DC | PRN

## 2021-10-21 MED ORDER — ONDANSETRON HCL 4 MG/2ML IJ SOLN
INTRAMUSCULAR | Status: DC | PRN
  Administered 2021-10-21: 4 mg via INTRAVENOUS

## 2021-10-21 MED ORDER — CHLORHEXIDINE GLUCONATE 0.12 % MT SOLN
OROMUCOSAL | Status: AC
  Filled 2021-10-21: qty 15

## 2021-10-21 MED ORDER — SODIUM CHLORIDE 0.9 % IR SOLN
Status: DC | PRN
  Administered 2021-10-21: 3000 mL

## 2021-10-21 MED ORDER — SODIUM CHLORIDE (PF) 0.9 % IJ SOLN
INTRAMUSCULAR | Status: DC | PRN
  Administered 2021-10-21: 10 mL

## 2021-10-21 MED ORDER — ACETAMINOPHEN 10 MG/ML IV SOLN
INTRAVENOUS | Status: DC | PRN
  Administered 2021-10-21: 1000 mg via INTRAVENOUS

## 2021-10-21 MED ORDER — LACTATED RINGERS IV SOLN: INTRAVENOUS | Status: DC | PRN

## 2021-10-21 MED ORDER — CHLORHEXIDINE GLUCONATE 0.12 % MT SOLN
15.0000 mL | Freq: Once | OROMUCOSAL | Status: AC
  Administered 2021-10-21: 15 mL via OROMUCOSAL

## 2021-10-21 MED ORDER — FENTANYL CITRATE (PF) 250 MCG/5ML IJ SOLN
INTRAMUSCULAR | Status: AC
  Filled 2021-10-21: qty 5

## 2021-10-21 MED ORDER — LIDOCAINE 2% (20 MG/ML) 5 ML SYRINGE
INTRAMUSCULAR | Status: DC | PRN
  Administered 2021-10-21: 60 mg via INTRAVENOUS

## 2021-10-21 MED ORDER — PROPOFOL 10 MG/ML IV BOLUS
INTRAVENOUS | Status: DC | PRN
  Administered 2021-10-21: 30 mg via INTRAVENOUS
  Administered 2021-10-21: 150 mg via INTRAVENOUS

## 2021-10-21 MED ORDER — ORAL CARE MOUTH RINSE: 15.0000 mL | Freq: Once | OROMUCOSAL | Status: AC

## 2021-10-21 MED ORDER — SUCCINYLCHOLINE CHLORIDE 200 MG/10ML IV SOSY
PREFILLED_SYRINGE | INTRAVENOUS | Status: DC | PRN
  Administered 2021-10-21: 60 mg via INTRAVENOUS

## 2021-10-21 MED ORDER — OXYCODONE HCL 5 MG PO TABS: 5.0000 mg | ORAL_TABLET | Freq: Once | ORAL | Status: DC | PRN

## 2021-10-21 MED ORDER — DEXAMETHASONE SODIUM PHOSPHATE 10 MG/ML IJ SOLN
INTRAMUSCULAR | Status: DC | PRN
  Administered 2021-10-21: 10 mg via INTRAVENOUS

## 2021-10-21 MED ORDER — SUGAMMADEX SODIUM 200 MG/2ML IV SOLN
INTRAVENOUS | Status: DC | PRN
  Administered 2021-10-21: 200 mg via INTRAVENOUS

## 2021-10-21 MED ORDER — PROPOFOL 10 MG/ML IV BOLUS
INTRAVENOUS | Status: AC
  Filled 2021-10-21: qty 20

## 2021-10-21 MED ORDER — MIDAZOLAM HCL 2 MG/2ML IJ SOLN
INTRAMUSCULAR | Status: DC | PRN
  Administered 2021-10-21: 2 mg via INTRAVENOUS

## 2021-10-21 MED ORDER — FENTANYL CITRATE (PF) 100 MCG/2ML IJ SOLN: 25.0000 ug | INTRAMUSCULAR | Status: DC | PRN

## 2021-10-21 MED ORDER — ACETAMINOPHEN 10 MG/ML IV SOLN
INTRAVENOUS | Status: AC
Start: 2021-10-21 — End: ?
  Filled 2021-10-21: qty 100

## 2021-10-21 MED ORDER — DEXMEDETOMIDINE (PRECEDEX) IN NS 20 MCG/5ML (4 MCG/ML) IV SYRINGE
PREFILLED_SYRINGE | INTRAVENOUS | Status: DC | PRN
  Administered 2021-10-21: 8 ug via INTRAVENOUS

## 2021-10-21 MED ORDER — FENTANYL CITRATE (PF) 250 MCG/5ML IJ SOLN
INTRAMUSCULAR | Status: DC | PRN
  Administered 2021-10-21: 100 ug via INTRAVENOUS
  Administered 2021-10-21: 50 ug via INTRAVENOUS
  Administered 2021-10-21: 100 ug via INTRAVENOUS

## 2021-10-21 MED ORDER — OXYCODONE HCL 5 MG PO TABS
5.0000 mg | ORAL_TABLET | Freq: Once | ORAL | Status: AC
  Administered 2021-10-21: 5 mg via ORAL
  Filled 2021-10-21: qty 1

## 2021-10-21 MED ORDER — BUPIVACAINE HCL (PF) 0.25 % IJ SOLN
INTRAMUSCULAR | Status: DC | PRN
  Administered 2021-10-21: 8 mL

## 2021-10-21 SURGICAL SUPPLY — 41 items
ADH SKN CLS APL DERMABOND .7 (GAUZE/BANDAGES/DRESSINGS) ×2
BAG SPEC RTRVL LRG 6X4 10 (ENDOMECHANICALS) ×2
CABLE HIGH FREQUENCY MONO STRZ (ELECTRODE) IMPLANT
CATH ROBINSON RED A/P 16FR (CATHETERS) ×2 IMPLANT
DERMABOND ADVANCED (GAUZE/BANDAGES/DRESSINGS) ×1
DERMABOND ADVANCED .7 DNX12 (GAUZE/BANDAGES/DRESSINGS) ×2 IMPLANT
DRSG OPSITE POSTOP 3X4 (GAUZE/BANDAGES/DRESSINGS) ×2 IMPLANT
DURAPREP 26ML APPLICATOR (WOUND CARE) ×3 IMPLANT
GLOVE BIO SURGEON STRL SZ8 (GLOVE) ×3 IMPLANT
GLOVE BIOGEL PI IND STRL 7.0 (GLOVE) ×4 IMPLANT
GLOVE BIOGEL PI INDICATOR 7.0 (GLOVE) ×2
GLOVE ORTHO TXT STRL SZ7.5 (GLOVE) ×3 IMPLANT
GOWN STRL REUS W/ TWL LRG LVL3 (GOWN DISPOSABLE) ×4 IMPLANT
GOWN STRL REUS W/ TWL XL LVL3 (GOWN DISPOSABLE) ×2 IMPLANT
GOWN STRL REUS W/TWL LRG LVL3 (GOWN DISPOSABLE) ×6
GOWN STRL REUS W/TWL XL LVL3 (GOWN DISPOSABLE) ×3
IRRIG SUCT STRYKERFLOW 2 WTIP (MISCELLANEOUS) ×3
IRRIGATION SUCT STRKRFLW 2 WTP (MISCELLANEOUS) ×1 IMPLANT
KIT TURNOVER KIT B (KITS) ×3 IMPLANT
NDL EPID 17G 5 ECHO TUOHY (NEEDLE) IMPLANT
NDL INSUFFLATION 14GA 120MM (NEEDLE) ×1 IMPLANT
NEEDLE EPID 17G 5 ECHO TUOHY (NEEDLE) IMPLANT
NEEDLE INSUFFLATION 14GA 120MM (NEEDLE) ×3 IMPLANT
NS IRRIG 1000ML POUR BTL (IV SOLUTION) ×3 IMPLANT
PACK LAPAROSCOPY BASIN (CUSTOM PROCEDURE TRAY) ×3 IMPLANT
PACK TRENDGUARD 450 HYBRID PRO (MISCELLANEOUS) ×1 IMPLANT
POUCH SPECIMEN RETRIEVAL 10MM (ENDOMECHANICALS) ×2 IMPLANT
PROTECTOR NERVE ULNAR (MISCELLANEOUS) ×6 IMPLANT
SET TUBE SMOKE EVAC HIGH FLOW (TUBING) ×3 IMPLANT
SHEARS HARMONIC ACE PLUS 36CM (ENDOMECHANICALS) ×2 IMPLANT
SLEEVE ENDOPATH XCEL 5M (ENDOMECHANICALS) ×3 IMPLANT
SOLUTION ELECTROLUBE (MISCELLANEOUS) IMPLANT
SPIKE FLUID TRANSFER (MISCELLANEOUS) ×1 IMPLANT
SUT VICRYL 0 UR6 27IN ABS (SUTURE) ×2 IMPLANT
SUT VICRYL 4-0 PS2 18IN ABS (SUTURE) ×3 IMPLANT
TOWEL GREEN STERILE FF (TOWEL DISPOSABLE) ×6 IMPLANT
TRAY FOLEY W/BAG SLVR 14FR (SET/KITS/TRAYS/PACK) IMPLANT
TRENDGUARD 450 HYBRID PRO PACK (MISCELLANEOUS) ×3
TROCAR XCEL NON-BLD 11X100MML (ENDOMECHANICALS) IMPLANT
TROCAR XCEL NON-BLD 5MMX100MML (ENDOMECHANICALS) ×3 IMPLANT
WARMER LAPAROSCOPE (MISCELLANEOUS) ×3 IMPLANT

## 2021-10-21 NOTE — MAU Note (Signed)
April Charles is a 31 y.o. at 3w 4dhere in MAU reporting: she currently has an ectopic pregnancy and has pain in RLQ.  Reports began this morning between 1000 & 1100.  Endorses spotting with wiping.  Had MTX last Wednesday.  Reports took Tylenol for pain a few hours ago, no relief noted. LMP: N/A Onset of complaint: today Pain score: 10/10 Vitals:   10/21/21 1343  BP: 122/80  Pulse: 79  Resp: 18  Temp: 97.8 F (36.6 C)  SpO2: 99%     FHT:N/A Lab orders placed from triage:   None

## 2021-10-21 NOTE — H&P (Signed)
April Charles is an 31 y.o. female. She presented initially on 6-28 with episodes of severe pelvic pain.  HCG then 2290, ultrasound with right adnexal mass suspicious for ectopic pregnancy, no IUP visualized.  She was treated with MTX.  Follow-up HCG on 7-1 was 3308.  Today she has had some spotting and more pelvic pain.  HCG now 1231, ultrasound with continued right adnexal mass and more free pelvic fluid suspicious for hemoperitoneum from ruptured ectopic.  Hbg 13.2.  Pain is improved from dose of oxycodone earlier  Pertinent Gynecological History: OB History: G3, P1011 H/o left ectopic LTCS x 1   Menstrual History: Patient's last menstrual period was 09/26/2021 (exact date).    Past Medical History:  Diagnosis Date   Heart murmur    Hx as a child, no problems as an adult   IBS (irritable bowel syndrome)    no meds   Kidney stone     Past Surgical History:  Procedure Laterality Date   CESAREAN SECTION N/A 07/12/2020   Procedure: CESAREAN SECTION;  Surgeon: Edwinna Areola, DO;  Location: [facility];  Service: Obstetrics;  Laterality: N/A;   LAPAROSCOPY N/A 12/10/2015   Procedure: Operative Laparoscopy, Left Salpingectomy;  Surgeon: Edwinna Areola, DO;  Location: [facility];  Service: Gynecology;  Laterality: N/A;    History reviewed. No pertinent family history.  Social History:  reports that she has never smoked. She has never used smokeless tobacco. She reports that she does not drink alcohol and does not use drugs.  Allergies: No Known Allergies  Medications Prior to Admission  Medication Sig Dispense Refill Last Dose   acetaminophen (TYLENOL) 500 MG tablet Take 2 tablets (1,000 mg total) by mouth every 6 (six) hours. 30 tablet 0 10/21/2021 at 1100   oxyCODONE (OXY IR/ROXICODONE) 5 MG immediate release tablet Take 1-2 tablets (5-10 mg total) by mouth every 4 (four) hours as needed for moderate pain. 15 tablet 0    Prenatal Vit-Fe Fumarate-FA (PRENATAL MULTIVITAMIN) TABS  tablet Take 1 tablet by mouth daily at 12 noon.       Review of Systems  Respiratory: Negative.    Cardiovascular: Negative.     Blood pressure 124/83, pulse 89, temperature 97.8 F (36.6 C), temperature source Oral, resp. rate 16, height 5' (1.524 m), weight 58.1 kg, last menstrual period 09/26/2021, SpO2 100 %, unknown if currently breastfeeding. Physical Exam Vitals reviewed.  Constitutional:      Appearance: She is well-developed.  Cardiovascular:     Rate and Rhythm: Normal rate and regular rhythm.  Pulmonary:     Effort: Pulmonary effort is normal. No respiratory distress.  Abdominal:     General: Abdomen is flat.     Palpations: Abdomen is soft.     Tenderness: There is abdominal tenderness in the right lower quadrant.  Neurological:     Mental Status: She is alert.     Results for orders placed or performed during the hospital encounter of 10/21/21 (from the past 24 hour(s))  Urinalysis, Routine w reflex microscopic Urine, Clean Catch     Status: Abnormal   Collection Time: 10/21/21  2:09 PM  Result Value Ref Range   Color, Urine YELLOW YELLOW   APPearance CLEAR CLEAR   Specific Gravity, Urine 1.024 1.005 - 1.030   pH 5.0 5.0 - 8.0   Glucose, UA NEGATIVE NEGATIVE mg/dL   Hgb urine dipstick SMALL (A) NEGATIVE   Bilirubin Urine NEGATIVE NEGATIVE   Ketones, ur 5 (A) NEGATIVE mg/dL  Protein, ur NEGATIVE NEGATIVE mg/dL   Nitrite NEGATIVE NEGATIVE   Leukocytes,Ua NEGATIVE NEGATIVE   RBC / HPF 0-5 0 - 5 RBC/hpf   WBC, UA 0-5 0 - 5 WBC/hpf   Bacteria, UA NONE SEEN NONE SEEN   Squamous Epithelial / LPF 0-5 0 - 5   Mucus PRESENT    Hyaline Casts, UA PRESENT    Non Squamous Epithelial 0-5 (A) NONE SEEN  CBC     Status: None   Collection Time: 10/21/21  2:11 PM  Result Value Ref Range   WBC 9.6 4.0 - 10.5 K/uL   RBC 4.71 3.87 - 5.11 MIL/uL   Hemoglobin 13.2 12.0 - 15.0 g/dL   HCT 96.0 45.4 - 09.8 %   MCV 82.6 80.0 - 100.0 fL   MCH 28.0 26.0 - 34.0 pg   MCHC 33.9  30.0 - 36.0 g/dL   RDW 11.9 14.7 - 82.9 %   Platelets 221 150 - 400 K/uL   nRBC 0.0 0.0 - 0.2 %  hCG, quantitative, pregnancy     Status: Abnormal   Collection Time: 10/21/21  2:11 PM  Result Value Ref Range   hCG, Beta Chain, Quant, S 1,231 (H) <5 mIU/mL    US OB Transvaginal  Result Date: 10/21/2021 CLINICAL DATA:  Known right adnexal ectopic pregnancy, previous methotrexate therapy, concern for rupture EXAM: TRANSVAGINAL OB ULTRASOUND TECHNIQUE: Transvaginal ultrasound was performed for complete evaluation of the gestation as well as the maternal uterus, adnexal regions, and pelvic cul-de-sac. COMPARISON:  10/12/2021 FINDINGS: Intrauterine gestational sac: None Yolk sac:  Not Visualized. Embryo:  Not Visualized. Maternal uterus/adnexae: Uterus is retroverted and unremarkable. Left ovary measures 3.7 x 3.2 x 1.8 cm and the right ovary measures 3.3 x 2.2 x 2.4 cm. Within the right adnexa, the ectopic pregnancy seen previously is again identified measuring approximately 1.5 x 1.2 cm. There has been interval increase in the free fluid throughout the pelvis, which is now mildly complex concerning for hemoperitoneum and ruptured ectopic. IMPRESSION: 1. Enlarged right adnexal ectopic pregnancy, with interval development of mild to moderate hemoperitoneum concerning for ruptured ectopic pregnancy. Critical Value/emergent results were called by telephone at the time of interpretation on 10/21/2021 at 4:06 pm to provider [facility] , who verbally acknowledged these results. Electronically Signed   By: Sharlet Salina M.D.   On: 10/21/2021 16:06    Assessment/Plan: Probable ruptured right ectopic pregnancy.  Previous left ectopic treated with salpingectomy.  She does want to try to conceive again.  Discussed findings, medical and surgical options, risk of hemorrhage with untreated ruptured ectopic.  Will admit for laparoscopic treatment of ectopic pregnancy, will try to preserve the tube if possible.   Procedure and risks discussed, questions answered, OR notified.  Leighton Roach Symiah Nowotny 10/21/2021, 4:58 PM

## 2021-10-21 NOTE — Anesthesia Procedure Notes (Signed)
Procedure Name: Intubation Date/Time: 10/21/2021 6:56 PM  Performed by: Claudina Lick, CRNAPre-anesthesia Checklist: Patient identified, Emergency Drugs available, Suction available and Patient being monitored Patient Re-evaluated:Patient Re-evaluated prior to induction Oxygen Delivery Method: Circle system utilized Preoxygenation: Pre-oxygenation with 100% oxygen Induction Type: IV induction, Rapid sequence and Cricoid Pressure applied Laryngoscope Size: Miller and 2 Grade View: Grade I Tube type: Oral Tube size: 7.0 mm Number of attempts: 1 Airway Equipment and Method: Stylet Placement Confirmation: ETT inserted through vocal cords under direct vision, positive ETCO2 and breath sounds checked- equal and bilateral Secured at: 21 cm Tube secured with: Tape Dental Injury: Teeth and Oropharynx as per pre-operative assessment

## 2021-10-21 NOTE — Anesthesia Postprocedure Evaluation (Signed)
Anesthesia Post Note  Patient: Art therapist  Procedure(s) Performed: LAPAROSCOPIC LINEAR RIGHT SALPINGOSTOMY (Right)     Patient location during evaluation: PACU Anesthesia Type: General Level of consciousness: awake Pain management: pain level controlled Vital Signs Assessment: post-procedure vital signs reviewed and stable Respiratory status: spontaneous breathing, nonlabored ventilation, respiratory function stable and patient connected to nasal cannula oxygen Cardiovascular status: blood pressure returned to baseline and stable Postop Assessment: no apparent nausea or vomiting Anesthetic complications: no   No notable events documented.  Last Vitals:  Vitals:   10/21/21 2015 10/21/21 2030  BP: (!) 108/91 113/80  Pulse: 94 93  Resp: 16 14  Temp:  36.8 C  SpO2: 100% 100%    Last Pain:  Vitals:   10/21/21 2030  TempSrc:   PainSc: 0-No pain                 Bienvenido Proehl P Azalya Galyon

## 2021-10-21 NOTE — Transfer of Care (Signed)
Immediate Anesthesia Transfer of Care Note  Patient: April Charles  Procedure(s) Performed: LAPAROSCOPIC LINEAR RIGHT SALPINGOSTOMY (Right)  Patient Location: PACU  Anesthesia Type:General  Level of Consciousness: drowsy  Airway & Oxygen Therapy: Patient Spontanous Breathing  Post-op Assessment: Report given to RN and Post -op Vital signs reviewed and stable  Post vital signs: Reviewed and stable  Last Vitals:  Vitals Value Taken Time  BP 113/78 10/21/21 2001  Temp    Pulse 115 10/21/21 2004  Resp 15 10/21/21 2004  SpO2 97 % 10/21/21 2004  Vitals shown include unvalidated device data.  Last Pain:  Vitals:   10/21/21 1619  TempSrc:   PainSc: 2          Complications: No notable events documented.

## 2021-10-21 NOTE — MAU Provider Note (Signed)
History     CSN: [id number]  Arrival date and time: 10/21/21 1325   Event Date/Time   First Provider Initiated Contact with Patient 10/21/21 1359      Chief Complaint  Patient presents with   Abdominal Pain   April Charles a  31 y.o. G3P1011 at [redacted]w[redacted]d presents to MAU with Right sided pain since 10:30/11 this am currently rating it 9/10. She attempted Tylenol 1000mg  11am with no relief. She was recently seen on 6/28 with a known right sided ectopic pregnancy, and treated with MTX. Her BHcg has been steadily decreasing. Last quant was 7/5 and it was 1637. She has had some brown spotting with wiping but denies bright red vaginal bleeding. Denies back pain, but endorses painful urination. She has no other complaints at this time. Last full meal at 0730 this AM, but patient had 2 Reece's and a beef jerky stick prior to arrival.         OB History     Gravida  3   Para  1   Term  1   Preterm      AB  1   Living  1      SAB      IAB      Ectopic  1   Multiple      Live Births  1           Past Medical History:  Diagnosis Date   Heart murmur    Hx as a child, no problems as an adult   IBS (irritable bowel syndrome)    no meds   Kidney stone     Past Surgical History:  Procedure Laterality Date   CESAREAN SECTION N/A 07/12/2020   Procedure: CESAREAN SECTION;  Surgeon: 07/14/2020, DO;  Location: [facility];  Service: Obstetrics;  Laterality: N/A;   LAPAROSCOPY N/A 12/10/2015   Procedure: Operative Laparoscopy, Left Salpingectomy;  Surgeon: 12/12/2015, DO;  Location: [facility] ORS;  Service: Gynecology;  Laterality: N/A;    History reviewed. No pertinent family history.  Social History   Tobacco Use   Smoking status: Never   Smokeless tobacco: Never  Vaping Use   Vaping Use: Never used  Substance Use Topics   Alcohol use: No   Drug use: No    Allergies: No Known Allergies  Medications Prior to Admission  Medication Sig Dispense  Refill Last Dose   acetaminophen (TYLENOL) 500 MG tablet Take 2 tablets (1,000 mg total) by mouth every 6 (six) hours. 30 tablet 0 10/21/2021 at 1100   oxyCODONE (OXY IR/ROXICODONE) 5 MG immediate release tablet Take 1-2 tablets (5-10 mg total) by mouth every 4 (four) hours as needed for moderate pain. 15 tablet 0    Prenatal Vit-Fe Fumarate-FA (PRENATAL MULTIVITAMIN) TABS tablet Take 1 tablet by mouth daily at 12 noon.       Review of Systems  Constitutional:  Negative for chills and fatigue.  Respiratory:  Negative for chest tightness and shortness of breath.   Cardiovascular:  Negative for chest pain.  Gastrointestinal:  Positive for abdominal pain. Negative for abdominal distention, constipation, diarrhea, nausea and vomiting.  Genitourinary:  Positive for dysuria and pelvic pain. Negative for difficulty urinating, flank pain, urgency, vaginal bleeding, vaginal discharge and vaginal pain.  Musculoskeletal:  Negative for back pain.  Neurological:  Negative for light-headedness and headaches.   Physical Exam   Blood pressure 124/83, pulse 89, temperature 97.8 F (36.6 C), temperature source Oral, resp. rate  16, height 5' (1.524 m), weight 58.1 kg, last menstrual period 09/26/2021, SpO2 100 %, unknown if currently breastfeeding.  Physical Exam Vitals and nursing note reviewed.  Constitutional:      General: She is not in acute distress.    Appearance: She is well-developed.  HENT:     Head: Normocephalic.  Abdominal:     Palpations: Abdomen is soft.     Tenderness: There is abdominal tenderness in the right lower quadrant. There is guarding. There is no right CVA tenderness.  Skin:    General: Skin is warm and dry.  Neurological:     Mental Status: She is alert and oriented to person, place, and time.  Psychiatric:        Mood and Affect: Mood normal.     MAU Course  Procedures Lab Orders         CBC         hCG, quantitative, pregnancy         Urinalysis, Routine w reflex  microscopic    No current facility-administered medications on file prior to encounter.   Current Outpatient Medications on File Prior to Encounter  Medication Sig Dispense Refill   acetaminophen (TYLENOL) 500 MG tablet Take 2 tablets (1,000 mg total) by mouth every 6 (six) hours. 30 tablet 0   oxyCODONE (OXY IR/ROXICODONE) 5 MG immediate release tablet Take 1-2 tablets (5-10 mg total) by mouth every 4 (four) hours as needed for moderate pain. 15 tablet 0   Prenatal Vit-Fe Fumarate-FA (PRENATAL MULTIVITAMIN) TABS tablet Take 1 tablet by mouth daily at 12 noon.     Results for orders placed or performed during the hospital encounter of 10/21/21 (from the past 24 hour(s))  Urinalysis, Routine w reflex microscopic Urine, Clean Catch     Status: Abnormal   Collection Time: 10/21/21  2:09 PM  Result Value Ref Range   Color, Urine YELLOW YELLOW   APPearance CLEAR CLEAR   Specific Gravity, Urine 1.024 1.005 - 1.030   pH 5.0 5.0 - 8.0   Glucose, UA NEGATIVE NEGATIVE mg/dL   Hgb urine dipstick SMALL (A) NEGATIVE   Bilirubin Urine NEGATIVE NEGATIVE   Ketones, ur 5 (A) NEGATIVE mg/dL   Protein, ur NEGATIVE NEGATIVE mg/dL   Nitrite NEGATIVE NEGATIVE   Leukocytes,Ua NEGATIVE NEGATIVE   RBC / HPF 0-5 0 - 5 RBC/hpf   WBC, UA 0-5 0 - 5 WBC/hpf   Bacteria, UA NONE SEEN NONE SEEN   Squamous Epithelial / LPF 0-5 0 - 5   Mucus PRESENT    Hyaline Casts, UA PRESENT    Non Squamous Epithelial 0-5 (A) NONE SEEN  CBC     Status: None   Collection Time: 10/21/21  2:11 PM  Result Value Ref Range   WBC 9.6 4.0 - 10.5 K/uL   RBC 4.71 3.87 - 5.11 MIL/uL   Hemoglobin 13.2 12.0 - 15.0 g/dL   HCT 22.0 25.4 - 27.0 %   MCV 82.6 80.0 - 100.0 fL   MCH 28.0 26.0 - 34.0 pg   MCHC 33.9 30.0 - 36.0 g/dL   RDW 62.3 76.2 - 83.1 %   Platelets 221 150 - 400 K/uL   nRBC 0.0 0.0 - 0.2 %  hCG, quantitative, pregnancy     Status: Abnormal   Collection Time: 10/21/21  2:11 PM  Result Value Ref Range   hCG, Beta Chain,  Quant, S 1,231 (H) <5 mIU/mL   US OB Transvaginal  Result Date: 10/21/2021 CLINICAL DATA:  Known right adnexal ectopic pregnancy, previous methotrexate therapy, concern for rupture EXAM: TRANSVAGINAL OB ULTRASOUND TECHNIQUE: Transvaginal ultrasound was performed for complete evaluation of the gestation as well as the maternal uterus, adnexal regions, and pelvic cul-de-sac. COMPARISON:  10/12/2021 FINDINGS: Intrauterine gestational sac: None Yolk sac:  Not Visualized. Embryo:  Not Visualized. Maternal uterus/adnexae: Uterus is retroverted and unremarkable. Left ovary measures 3.7 x 3.2 x 1.8 cm and the right ovary measures 3.3 x 2.2 x 2.4 cm. Within the right adnexa, the ectopic pregnancy seen previously is again identified measuring approximately 1.5 x 1.2 cm. There has been interval increase in the free fluid throughout the pelvis, which is now mildly complex concerning for hemoperitoneum and ruptured ectopic. IMPRESSION: 1. Enlarged right adnexal ectopic pregnancy, with interval development of mild to moderate hemoperitoneum concerning for ruptured ectopic pregnancy. Critical Value/emergent results were called by telephone at the time of interpretation on 10/21/2021 at 4:06 pm to provider [facility] , who verbally acknowledged these results. Electronically Signed   By: Sharlet Salina M.D.   On: 10/21/2021 16:06    MDM Ruptured ectopic pregnancy identified with increased free fluid in the pelvis noted. Enlarged right adnexa from previous scan on 10/12/21.   Call made to on-call Attending @ [phone]. Henreitta Leber, MD on the way to MAU to assess patient.    Assessment and Plan  Transfer of care to Henreitta Leber, MD upon arrival. Please see MD note for care.   Claudette Head, CNM  10/21/2021, 5:01 PM

## 2021-10-21 NOTE — Anesthesia Preprocedure Evaluation (Addendum)
Anesthesia Evaluation  Patient identified by MRN, date of birth, ID band Patient awake    Reviewed: Allergy & Precautions, NPO status , Patient's Chart, lab work & pertinent test results  History of Anesthesia Complications Negative for: history of anesthetic complications  Airway Mallampati: II  TM Distance: >3 FB Neck ROM: Full    Dental  (+) Dental Advisory Given, Teeth Intact   Pulmonary neg pulmonary ROS,    Pulmonary exam normal        Cardiovascular negative cardio ROS Normal cardiovascular exam     Neuro/Psych negative neurological ROS  negative psych ROS   GI/Hepatic Neg liver ROS,  IBS    Endo/Other  diabetes, Gestational  Renal/GU negative Renal ROS     Musculoskeletal negative musculoskeletal ROS (+)   Abdominal   Peds  Hematology negative hematology ROS (+)   Anesthesia Other Findings   Reproductive/Obstetrics (+) Pregnancy  Ruptured ectopic pregnancy                             Anesthesia Physical Anesthesia Plan  ASA: 2 and emergent  Anesthesia Plan: General   Post-op Pain Management: Toradol IV (intra-op)*   Induction: Intravenous, Rapid sequence and Cricoid pressure planned  PONV Risk Score and Plan: 3 and Treatment may vary due to age or medical condition, Ondansetron, Dexamethasone and Midazolam  Airway Management Planned: Oral ETT  Additional Equipment: None  Intra-op Plan:   Post-operative Plan: Extubation in OR  Informed Consent: I have reviewed the patients History and Physical, chart, labs and discussed the procedure including the risks, benefits and alternatives for the proposed anesthesia with the patient or authorized representative who has indicated his/her understanding and acceptance.     Dental advisory given  Plan Discussed with: CRNA and Anesthesiologist  Anesthesia Plan Comments:       Anesthesia Quick Evaluation

## 2021-10-21 NOTE — Discharge Instructions (Signed)
Routine instructions for laparoscopy 

## 2021-10-21 NOTE — Op Note (Signed)
Preoperative diagnosis: Probable ruptured right ectopic pregnancy Postoperative diagnosis: Right ectopic pregnancy Procedure: Laparoscopy with linear right salpingostomy Surgeon: Lavina Hamman M.D.  Anesthesia: Gen. Endotracheal tube  Findings: She had a normal abdomen and pelvis with normal uterus  and ovaries, absent left fallopian tube, clot at right fimbria, bulging right fallopian tube, 200 cc blood in culdesac Specimens: None  Estimated blood loss: 50  Complications: None  Procedure in detail  The patient was taken to the operating room and placed in the dorsosupine position. General anesthesia was induced. Her legs were placed in mobile stirrups and her left arm was tucked to her side. Abdomen perineum and vagina were then prepped and draped in the usual sterile fashion, bladder drained with a red Robinson catheter, a Hulka tenaculum was applied to the cervix for uterine manipulation. Infraumbilical skin was then infiltrated with quarter percent Marcaine and a 1 cm vertical incision was made. The veress needle was inserted into the peritoneal cavity and placement confirmed by the water drop test and an opening pressure of 3 mm of mercury. CO2 was insufflated to a pressure of 12 mm of mercury and the veress needle was removed. A 22mm disposable trocar was then introduced with direct visualization with the laparoscope. A 11 mm port was then placed on the left side also under direct visualization. Careful and thorough inspection revealed the above-mentioned findings.  The ectopic must have just been bleeding out the end of the tube as the tube itself was intact.  I used Harmonic scalpel to make a linear incision into the tube where it was bulging the most.  Hydrodissection was then used to flush out the ectopic pregnancy-it was suctioned up and not removed as a specimen.  Bleeding from the tube was controlled with harmonic scalpel.  The pelvis was irrigated, tube essentially hemostatic.    The 11 mm  port was removed under direct visualization. A deep suture of 0 Vicryl was placed to close the fascia.  All gas was allowed to deflate from the abdomen and the umbilical trocar was removed. Skin incisions were then closed with interrupted subcuticular sutures of 4-0 Vicryl followed by Dermabond. The Hulka tenaculum was removed. The patient was taken down from stirrups. She was awakened in the operating room and taken to the recovery room in stable condition after tolerating the procedure well. Counts were correct and she had PAS hose on throughout the procedure.

## 2021-10-22 ENCOUNTER — Encounter (HOSPITAL_COMMUNITY): Payer: Self-pay | Admitting: Obstetrics and Gynecology

## 2021-11-03 DIAGNOSIS — O00101 Right tubal pregnancy without intrauterine pregnancy: Secondary | ICD-10-CM | POA: Diagnosis not present

## 2021-11-14 DIAGNOSIS — O021 Missed abortion: Secondary | ICD-10-CM | POA: Diagnosis not present

## 2021-11-24 DIAGNOSIS — Z6824 Body mass index (BMI) 24.0-24.9, adult: Secondary | ICD-10-CM | POA: Diagnosis not present

## 2021-11-24 DIAGNOSIS — J029 Acute pharyngitis, unspecified: Secondary | ICD-10-CM | POA: Diagnosis not present

## 2022-01-16 DIAGNOSIS — Z6825 Body mass index (BMI) 25.0-25.9, adult: Secondary | ICD-10-CM | POA: Diagnosis not present

## 2022-01-16 DIAGNOSIS — R1031 Right lower quadrant pain: Secondary | ICD-10-CM | POA: Diagnosis not present

## 2022-01-16 DIAGNOSIS — R110 Nausea: Secondary | ICD-10-CM | POA: Diagnosis not present

## 2022-01-27 DIAGNOSIS — R1031 Right lower quadrant pain: Secondary | ICD-10-CM | POA: Diagnosis not present

## 2022-04-14 DIAGNOSIS — L608 Other nail disorders: Secondary | ICD-10-CM | POA: Diagnosis not present

## 2022-04-14 DIAGNOSIS — Z6825 Body mass index (BMI) 25.0-25.9, adult: Secondary | ICD-10-CM | POA: Diagnosis not present

## 2022-04-14 DIAGNOSIS — Z1331 Encounter for screening for depression: Secondary | ICD-10-CM | POA: Diagnosis not present

## 2022-04-14 DIAGNOSIS — D649 Anemia, unspecified: Secondary | ICD-10-CM | POA: Diagnosis not present

## 2022-04-14 DIAGNOSIS — Z1389 Encounter for screening for other disorder: Secondary | ICD-10-CM | POA: Diagnosis not present

## 2022-04-14 DIAGNOSIS — R5383 Other fatigue: Secondary | ICD-10-CM | POA: Diagnosis not present

## 2022-04-14 DIAGNOSIS — Z0000 Encounter for general adult medical examination without abnormal findings: Secondary | ICD-10-CM | POA: Diagnosis not present

## 2022-08-08 IMAGING — DX DG FINGER INDEX 2+V*R*
3 series · 3 of 3 positions shown · non-contrast
Comparison: None.

CLINICAL DATA: Status post dog bite with puncture wounds to the
right index finger. Initial encounter.

EXAM:
RIGHT INDEX FINGER 2+V

[finger ap]
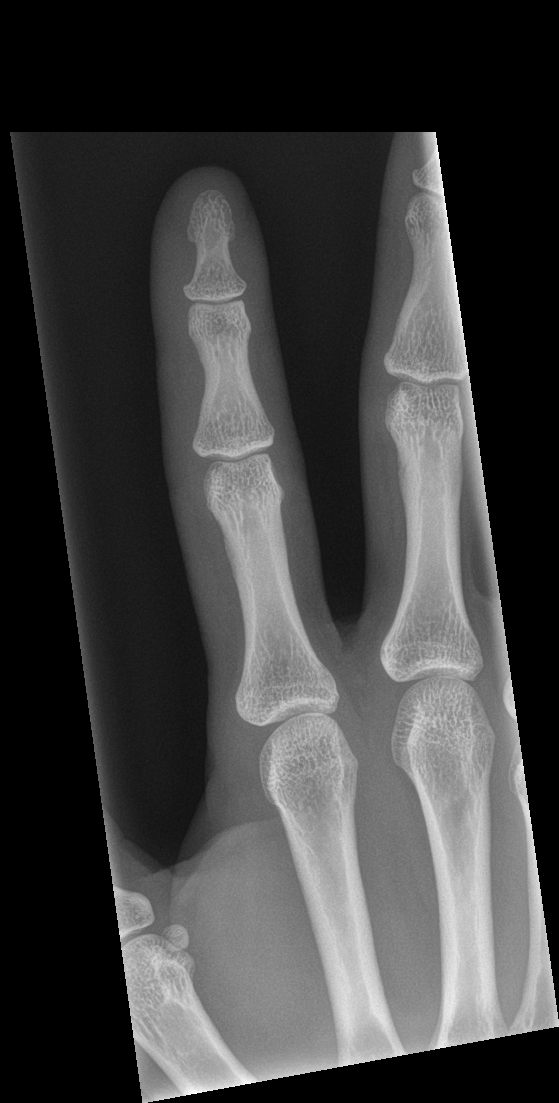

[finger obl]
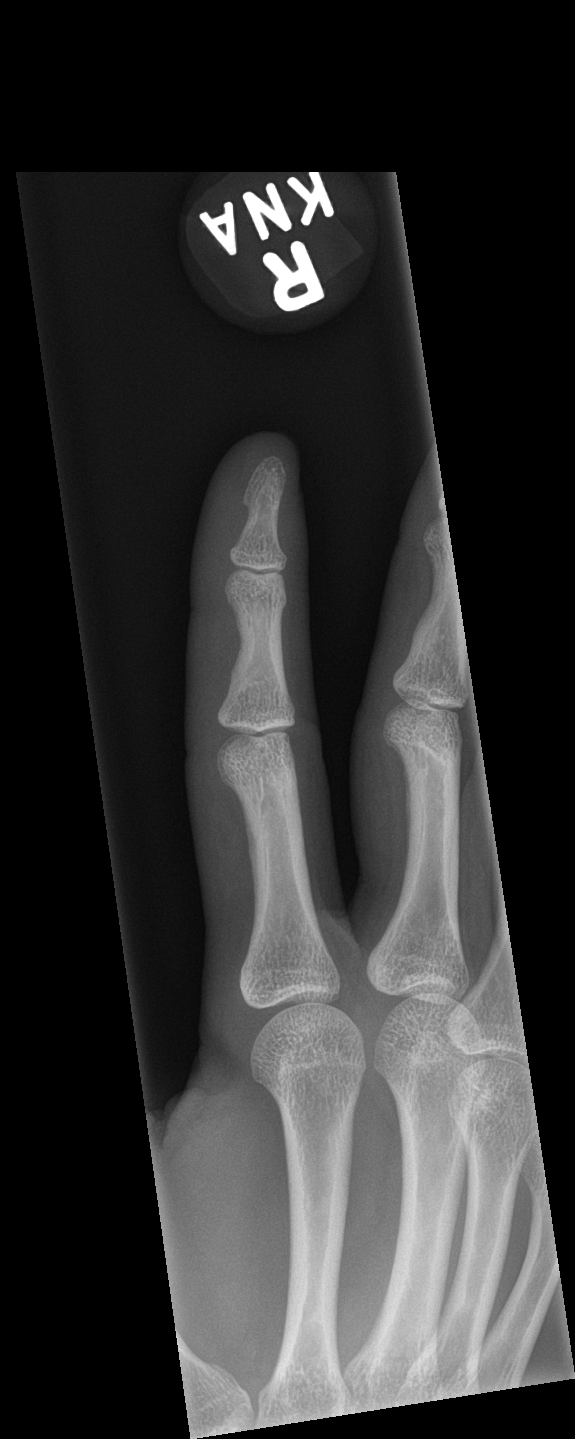

[finger lat]
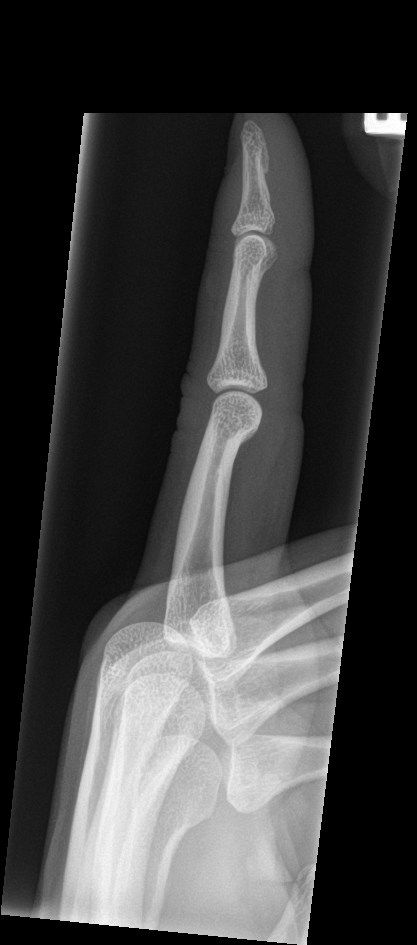

[3 of 3 positions shown; findings below may reference images not displayed]

FINDINGS: There is no evidence of fracture or dislocation. There is no
evidence of arthropathy or other focal bone abnormality. Soft
tissues are unremarkable.
IMPRESSION: Negative exam.

## 2022-10-27 IMAGING — US US RENAL
1 series · 15 of 25 positions shown · non-contrast
Comparison: 07/20/2017

CLINICAL DATA: Left-sided flank pain

EXAM:
RENAL / URINARY TRACT ULTRASOUND COMPLETE

[Series 1: us renal · 15 of 31 slices shown]
[im 1/31]
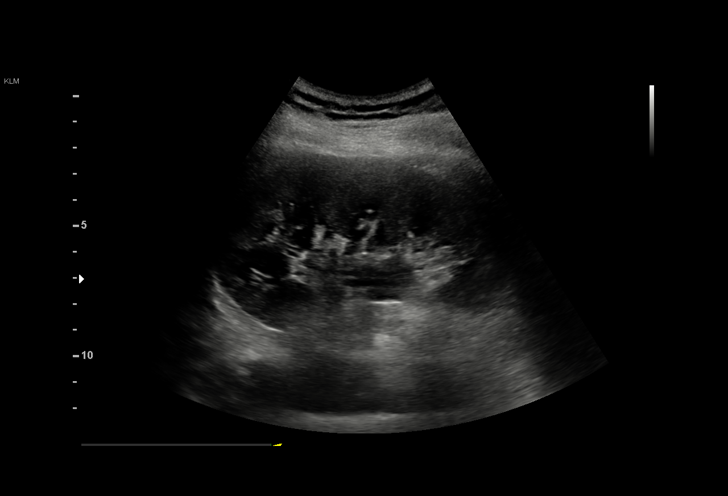
[im 3/31]
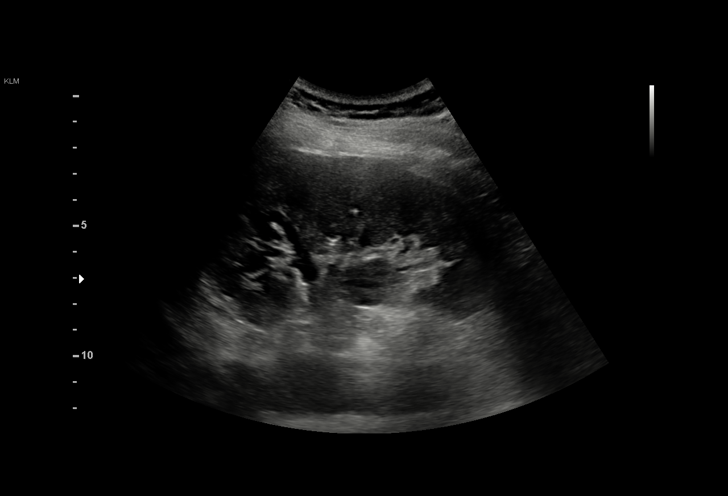
[im 6/31]
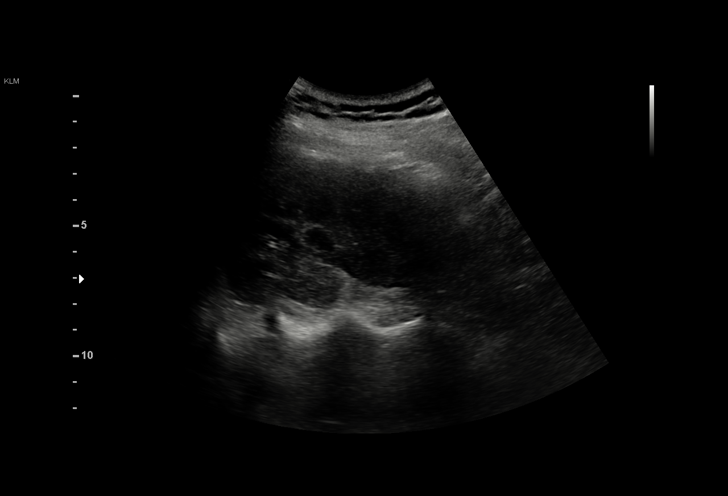
[im 7/31]
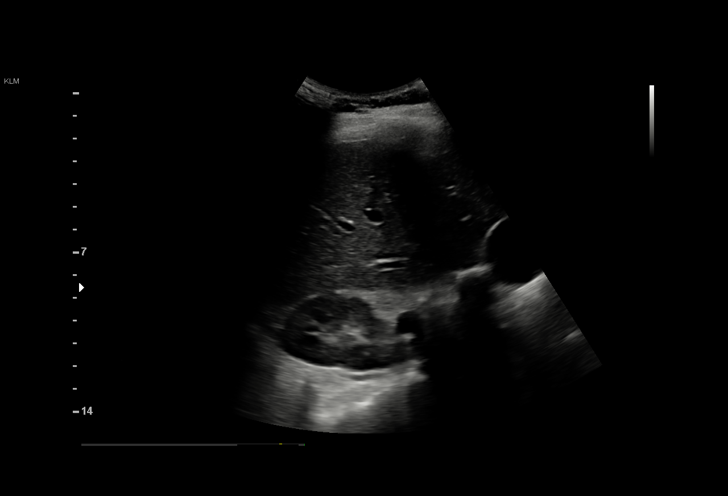
[im 9/31]
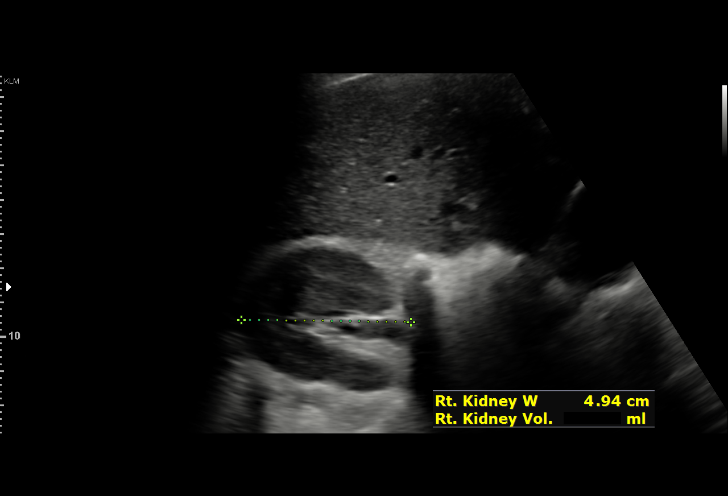
[im 12/31]
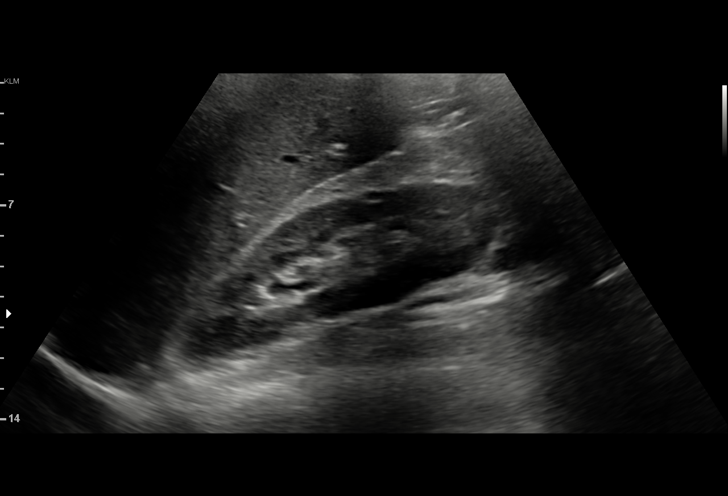
[im 13/31]
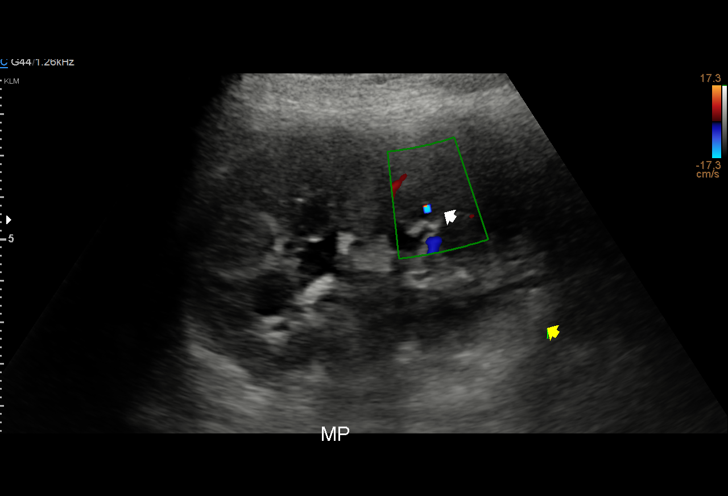
[im 16/31]
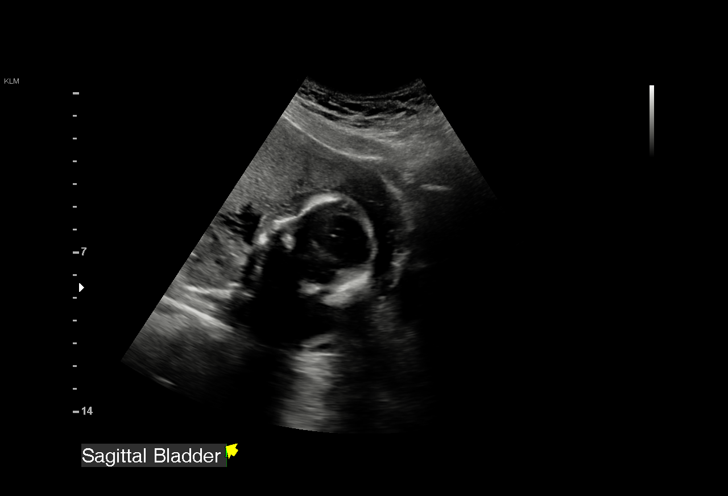
[im 18/31]
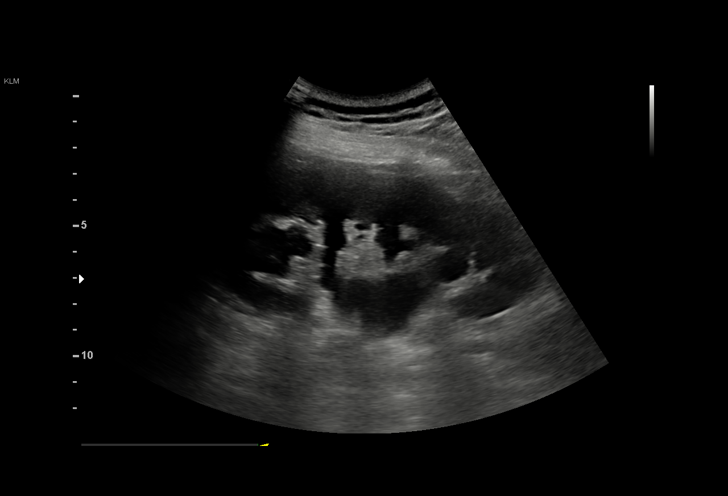
[im 19/31]
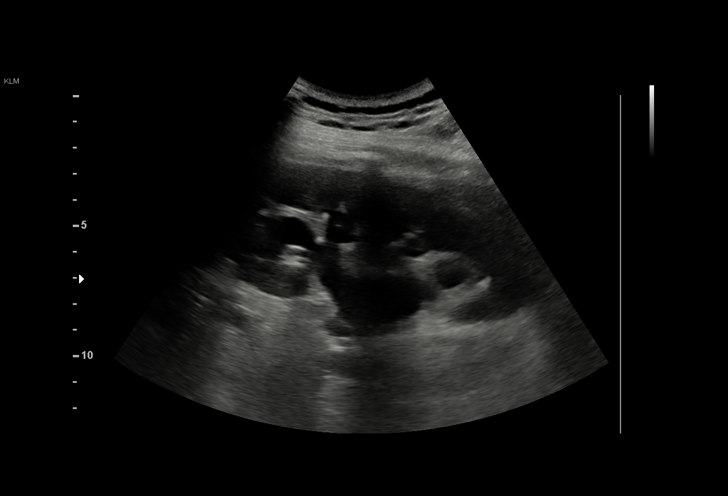
[im 22/31]
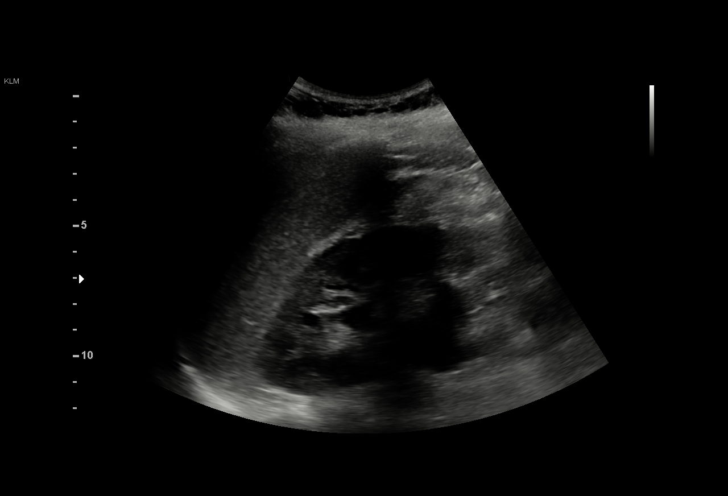
[im 24/31]
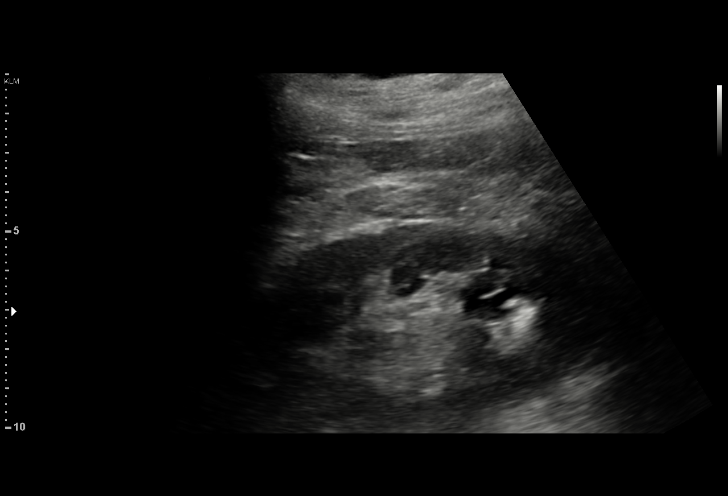
[im 26/31]
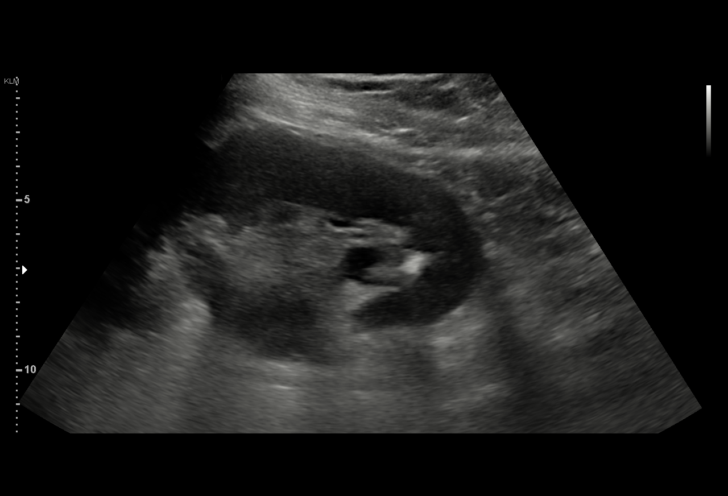
[im 28/31]
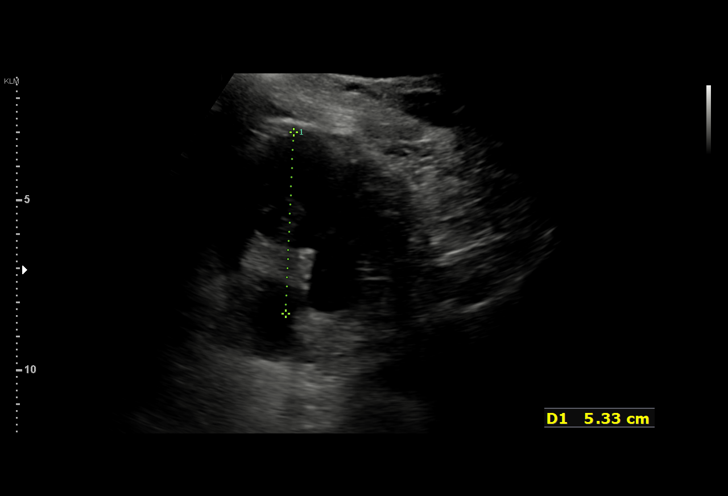
[im 31/31]
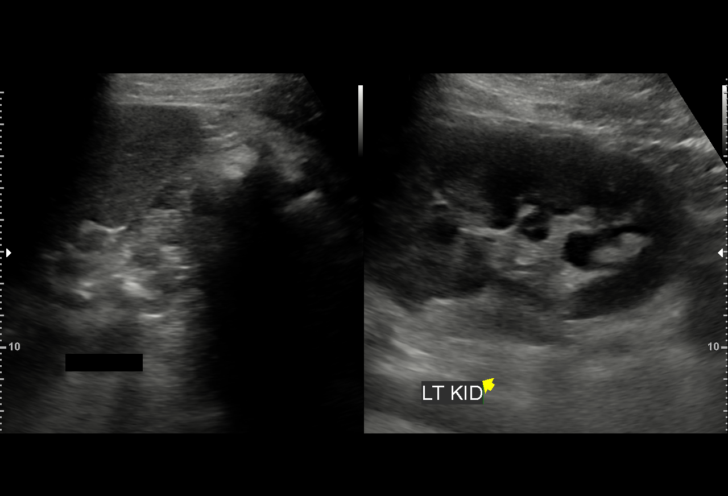

[15 of 25 positions shown; findings below may reference images not displayed]

FINDINGS: Right Kidney:

Renal measurements: 11.2 x 5.6 x 4.9 cm = volume: 160 mL. Mild
pelviectasis. There is a 4 mm interpolar calculus appearance with
ring down artifact.

Left Kidney:

Renal measurements: 13 x 6 x 4.5 cm = volume: 200 mL. Moderate
hydronephrosis. No visible obstructing process or mass/collection.

Bladder:

Empty. Cannot assess for ureteral jets.

Other:

None.
IMPRESSION: 1. Moderate left hydronephrosis. The bladder is empty and ureteral
jets cannot be assessed.
2. 4 mm right renal calculus.

## 2023-01-23 IMAGING — US US RENAL
1 series · 15 of 25 positions shown · non-contrast
Comparison: 04/02/2020

CLINICAL DATA: Acute right flank pain

EXAM:
RENAL / URINARY TRACT ULTRASOUND COMPLETE

[Series 1: us renal · 15 of 37 slices shown]
[im 1/37]
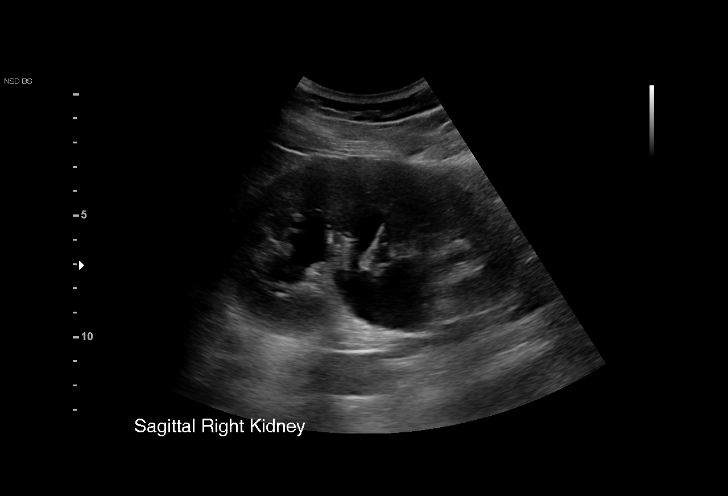
[im 4/37]
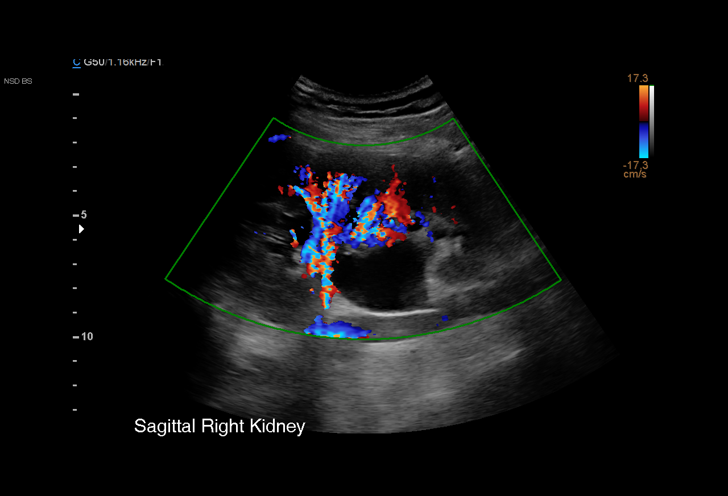
[im 7/37]
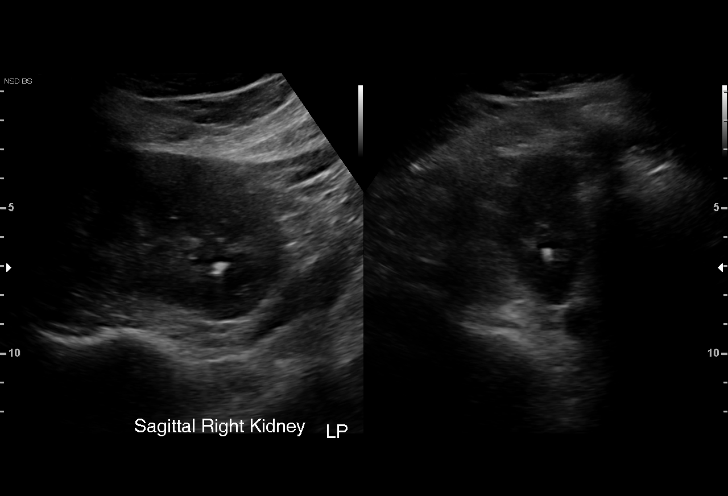
[im 8/37]
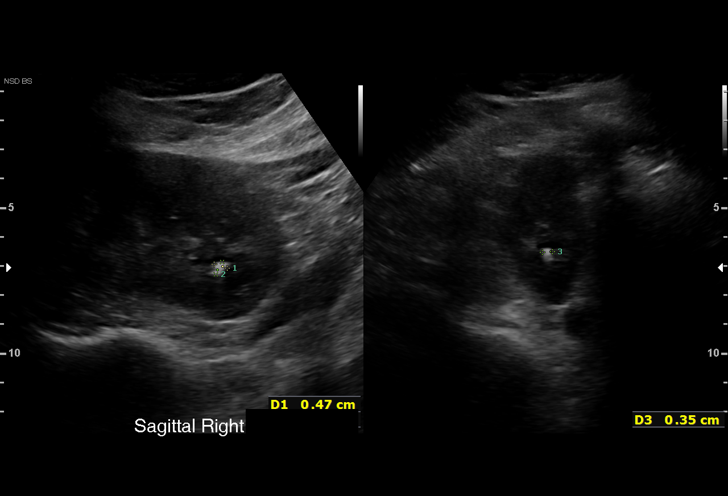
[im 11/37]
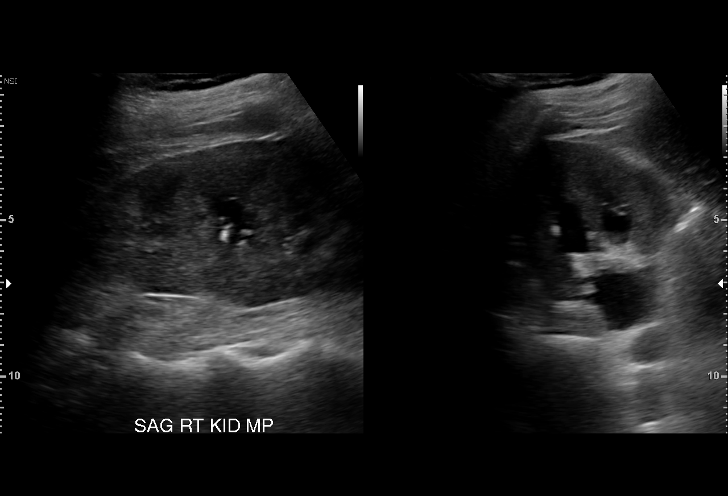
[im 14/37]
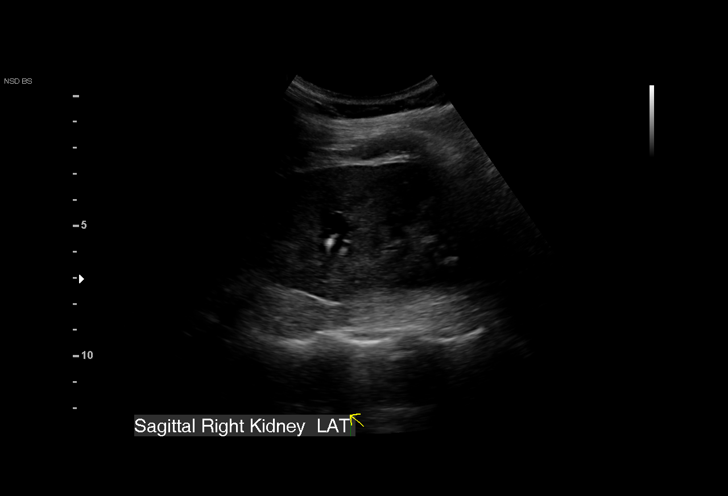
[im 16/37]
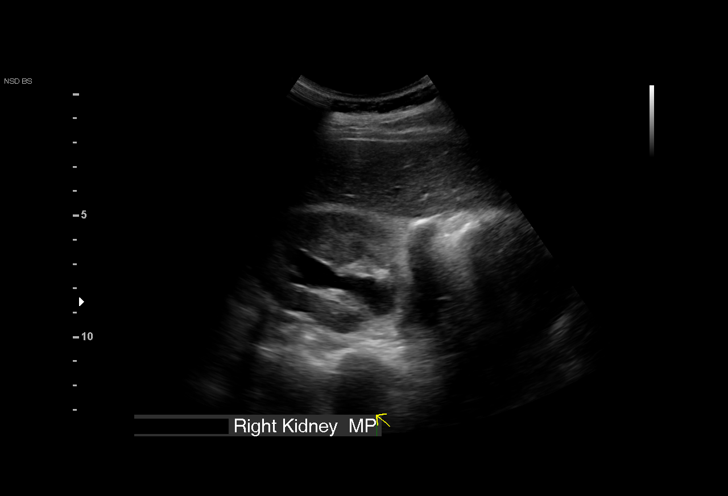
[im 19/37]
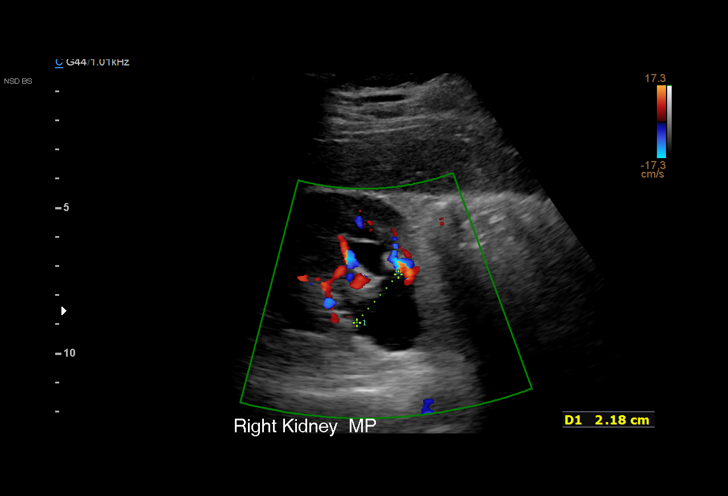
[im 22/37]
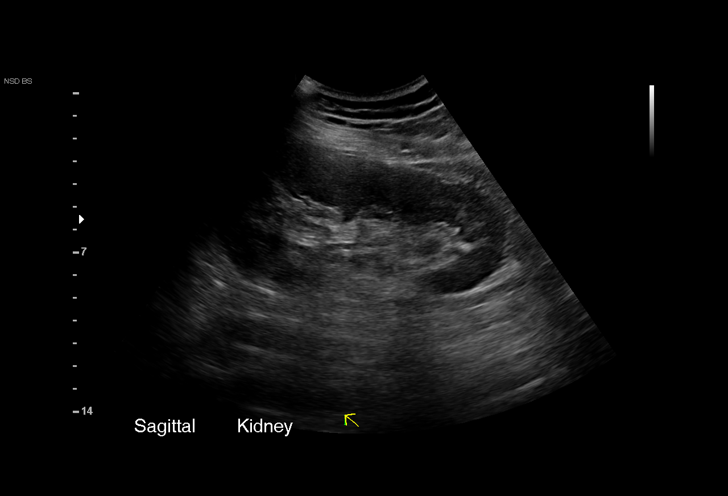
[im 23/37]
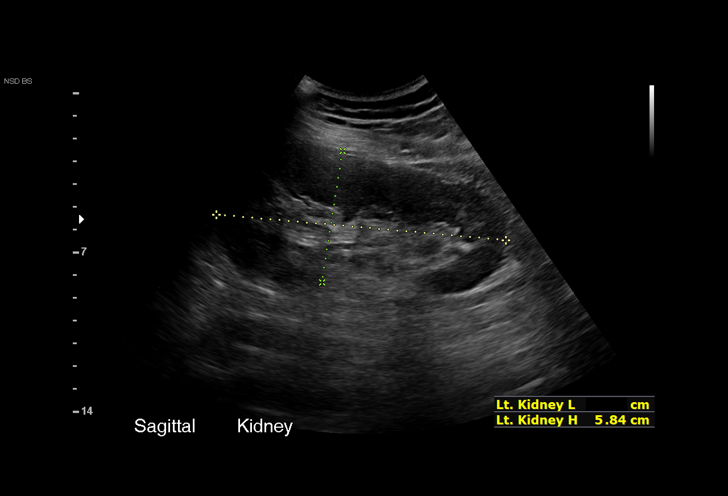
[im 26/37]
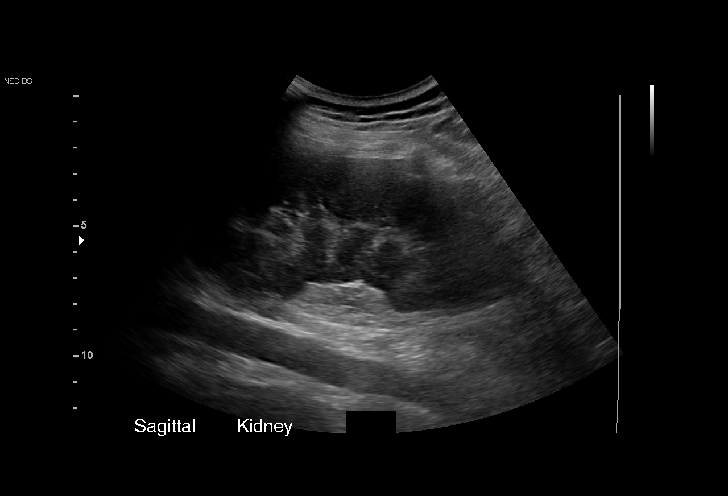
[im 29/37]
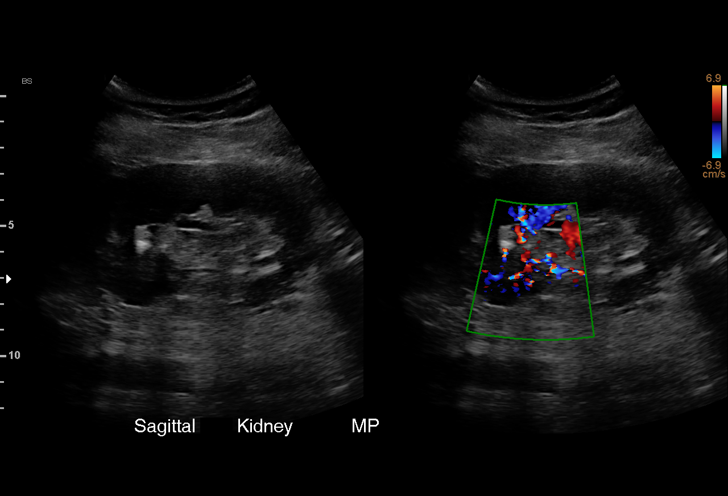
[im 31/37]
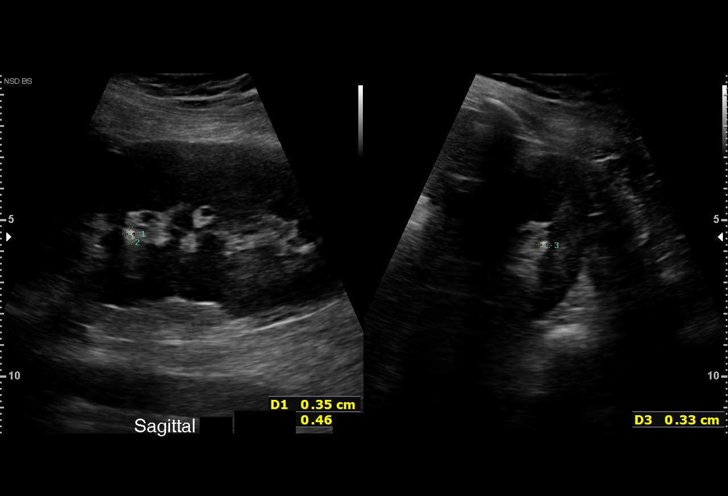
[im 34/37]
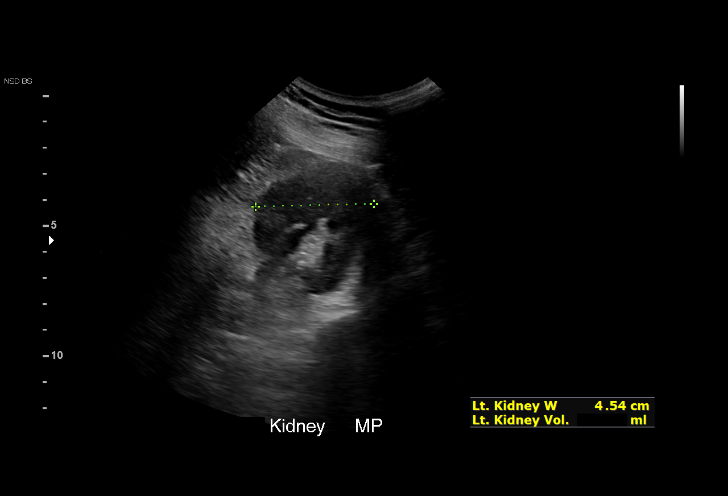
[im 37/37]
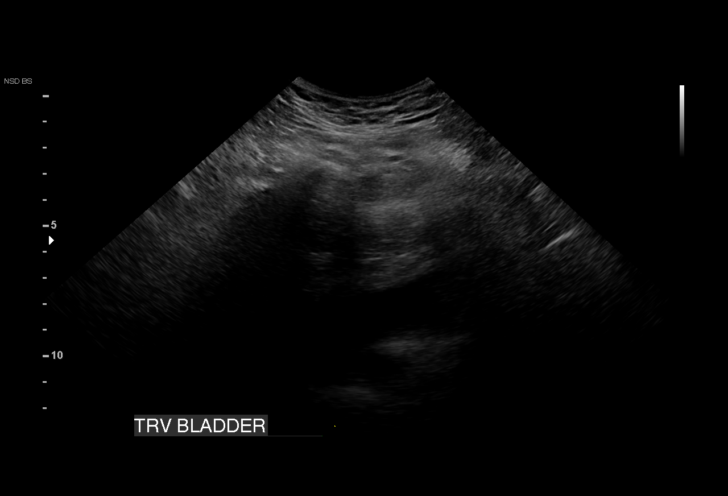

[15 of 25 positions shown; findings below may reference images not displayed]

FINDINGS: Right Kidney:

Renal measurements: 12.2 x 7.4 x 5.9 cm = volume: 275 mL. Moderate
hydronephrosis. Renal stones are seen in the mid and lower poles
measuring up to 5 mm.

Left Kidney:

Renal measurements: 12.8 x 5.8 x 4.5 cm = volume: 176 mL. 5 mm stone
in the midpole. No hydronephrosis.

Bladder:

Appears normal for degree of bladder distention.

Other:

None.
IMPRESSION: Moderate right hydronephrosis.

Bilateral nephrolithiasis.

## 2023-02-28 ENCOUNTER — Encounter (HOSPITAL_BASED_OUTPATIENT_CLINIC_OR_DEPARTMENT_OTHER): Payer: Self-pay | Admitting: Obstetrics and Gynecology

## 2023-02-28 NOTE — Progress Notes (Signed)
Spoke w/ via phone for pre-op interview--- pt Lab needs dos----  urine preg       Lab results------ no COVID test -----patient states asymptomatic no test needed Arrive at ------- 0530 on 03-09-2023 NPO after MN NO Solid Food.  Clear liquids from MN until--- 0430 Med rec completed Medications to take morning of surgery ----- none Diabetic medication ----- n/a Patient instructed no nail polish to be worn day of surgery Patient instructed to bring photo id and insurance card day of surgery Patient aware to have Driver (ride ) / caregiver    for 24 hours after surgery - husband, April Charles Patient Special Instructions ----- n/a Pre-Op special Instructions ----- sent inbox message in epic to dr Mindi Slicker, requested orders Patient verbalized understanding of instructions that were given at this phone interview. Patient denies chest pain, sob, fever, cough at the interview.

## 2023-03-07 NOTE — H&P (Signed)
April Charles is an 32 y.o. G33P1021 female here for sterilization. She is scheduled for a laparoscopic right salpingectomy. She has a history of right salpingostomy and left salpingectomy. She has completed childbearing.   Pertinent Gynecological History: Menses: flow is moderate Bleeding: regular  Contraception: condoms DES exposure: denies Blood transfusions: none Sexually transmitted diseases: no past history Previous GYN Procedures:  right salpingostomy   Last mammogram:  n/a  Date:   Last pap: normal Date: 04/2022 OB History: G3, P1021   Menstrual History: Menarche age: 4 Patient's last menstrual period was 02/18/2023 (exact date).    Past Medical History:  Diagnosis Date   Acne vulgaris    GERD (gastroesophageal reflux disease)    History of anemia    History of cervical dysplasia    History of ectopic pregnancy    2017  s/p left salpingectomy;   2023  s/p right salpingostomy   History of gestational diabetes    History of heart murmur in childhood    History of kidney stones    hx during pregnancy 2022   Irritable bowel syndrome with diarrhea     Past Surgical History:  Procedure Laterality Date   CESAREAN SECTION N/A 07/12/2020   Procedure: CESAREAN SECTION;  Surgeon: Edwinna Areola, DO;  Location: [facility];  Service: Obstetrics;  Laterality: N/A;   LAPAROSCOPY N/A 12/10/2015   Procedure: Operative Laparoscopy, Left Salpingectomy;  Surgeon: Edwinna Areola, DO;  Location: [facility] ORS;  Service: Gynecology;  Laterality: N/A;   LAPAROSCOPY Right 10/21/2021   Procedure: LAPAROSCOPIC LINEAR RIGHT SALPINGOSTOMY;  Surgeon: Lavina Hamman, MD;  Location: [facility] OR;  Service: Gynecology;  Laterality: Right;    History reviewed. No pertinent family history.  Social History:  reports that she has never smoked. She has never used smokeless tobacco. She reports that she does not drink alcohol and does not use drugs.  Allergies: No Known Allergies  No medications prior  to admission.    Review of Systems  Constitutional:  Negative for activity change, diaphoresis and fatigue.  Eyes:  Negative for photophobia and visual disturbance.  Respiratory:  Negative for chest tightness and shortness of breath.   Cardiovascular:  Negative for chest pain, palpitations and leg swelling.  Gastrointestinal:  Negative for abdominal pain and anal bleeding.  Endocrine: Negative for polyphagia.  Genitourinary:  Negative for pelvic pain and vaginal bleeding.  Musculoskeletal:  Negative for back pain.  Neurological:  Negative for facial asymmetry and light-headedness.  Psychiatric/Behavioral:  The patient is not nervous/anxious.     Height 5' (1.524 m), weight 60.3 kg, last menstrual period 02/18/2023, unknown if currently breastfeeding. Physical Exam Vitals and nursing note reviewed. Exam conducted with a chaperone present.  Constitutional:      Appearance: Normal appearance. She is normal weight.  Cardiovascular:     Rate and Rhythm: Normal rate.     Pulses: Normal pulses.  Pulmonary:     Effort: Pulmonary effort is normal.  Abdominal:     Palpations: Abdomen is soft.  Musculoskeletal:        General: Normal range of motion.     Cervical back: Normal range of motion.  Skin:    General: Skin is warm and dry.     Capillary Refill: Capillary refill takes 2 to 3 seconds.  Neurological:     General: No focal deficit present.     Mental Status: She is alert and oriented to person, place, and time. Mental status is at baseline.  Psychiatric:  Mood and Affect: Mood normal.        Behavior: Behavior normal.        Thought Content: Thought content normal.        Judgment: Judgment normal.     No results found for this or any previous visit (from the past 24 hour(s)).  No results found.  Assessment/Plan: 39 female here for scheduled surgery: laparoscopic right salpingectomy  - Admit  - ERAS protocol  - Confirm consent  - To OR when ready    April Charles 03/07/2023, 4:52 PM

## 2023-03-09 ENCOUNTER — Encounter (HOSPITAL_BASED_OUTPATIENT_CLINIC_OR_DEPARTMENT_OTHER): Payer: Self-pay | Admitting: Obstetrics and Gynecology

## 2023-03-09 ENCOUNTER — Other Ambulatory Visit: Payer: Self-pay

## 2023-03-09 ENCOUNTER — Ambulatory Visit (HOSPITAL_BASED_OUTPATIENT_CLINIC_OR_DEPARTMENT_OTHER): Payer: BC Managed Care – PPO | Admitting: Anesthesiology

## 2023-03-09 ENCOUNTER — Ambulatory Visit (HOSPITAL_BASED_OUTPATIENT_CLINIC_OR_DEPARTMENT_OTHER)
Admission: RE | Admit: 2023-03-09 | Discharge: 2023-03-09 | Disposition: A | Discharge status: Home / Self Care | Payer: BC Managed Care – PPO | Attending: Obstetrics and Gynecology | Admitting: Obstetrics and Gynecology

## 2023-03-09 ENCOUNTER — Encounter (HOSPITAL_BASED_OUTPATIENT_CLINIC_OR_DEPARTMENT_OTHER)
Admission: RE | Disposition: A | Discharge status: Home / Self Care | Payer: Self-pay | Source: Home / Self Care | Attending: Obstetrics and Gynecology

## 2023-03-09 DIAGNOSIS — Z01818 Encounter for other preprocedural examination: Secondary | ICD-10-CM

## 2023-03-09 DIAGNOSIS — Z9079 Acquired absence of other genital organ(s): Secondary | ICD-10-CM | POA: Diagnosis not present

## 2023-03-09 DIAGNOSIS — Z302 Encounter for sterilization: Secondary | ICD-10-CM | POA: Insufficient documentation

## 2023-03-09 HISTORY — DX: Gastro-esophageal reflux disease without esophagitis: K21.9

## 2023-03-09 HISTORY — DX: Personal history of other complications of pregnancy, childbirth and the puerperium: Z87.59

## 2023-03-09 HISTORY — DX: Personal history of cervical dysplasia: Z87.410

## 2023-03-09 HISTORY — DX: Irritable bowel syndrome with diarrhea: K58.0

## 2023-03-09 HISTORY — PX: LAPAROSCOPIC UNILATERAL SALPINGECTOMY: SHX5934

## 2023-03-09 HISTORY — DX: Personal history of diseases of the blood and blood-forming organs and certain disorders involving the immune mechanism: Z86.2

## 2023-03-09 HISTORY — DX: Acne vulgaris: L70.0

## 2023-03-09 HISTORY — DX: Personal history of urinary calculi: Z87.442

## 2023-03-09 HISTORY — DX: Personal history of gestational diabetes: Z86.32

## 2023-03-09 HISTORY — DX: Personal history of other diseases of the circulatory system: Z86.79

## 2023-03-09 LAB — TYPE AND SCREEN
ABO/RH(D): O POS
Antibody Screen: NEGATIVE

## 2023-03-09 LAB — POCT PREGNANCY, URINE: Preg Test, Ur: NEGATIVE

## 2023-03-09 SURGERY — SALPINGECTOMY, UNILATERAL, LAPAROSCOPIC
Anesthesia: General | Site: Pelvis | Laterality: Right

## 2023-03-09 MED ORDER — LIDOCAINE 2% (20 MG/ML) 5 ML SYRINGE
INTRAMUSCULAR | Status: DC | PRN
  Administered 2023-03-09: 100 mg via INTRAVENOUS

## 2023-03-09 MED ORDER — ROCURONIUM BROMIDE 10 MG/ML (PF) SYRINGE
PREFILLED_SYRINGE | INTRAVENOUS | Status: AC
  Filled 2023-03-09: qty 10

## 2023-03-09 MED ORDER — MIDAZOLAM HCL 2 MG/2ML IJ SOLN
INTRAMUSCULAR | Status: AC
  Filled 2023-03-09: qty 2

## 2023-03-09 MED ORDER — ACETAMINOPHEN 500 MG PO TABS
1000.0000 mg | ORAL_TABLET | ORAL | Status: AC
  Administered 2023-03-09: 1000 mg via ORAL

## 2023-03-09 MED ORDER — ROCURONIUM BROMIDE 10 MG/ML (PF) SYRINGE
PREFILLED_SYRINGE | INTRAVENOUS | Status: DC | PRN
  Administered 2023-03-09: 40 mg via INTRAVENOUS

## 2023-03-09 MED ORDER — PROPOFOL 10 MG/ML IV BOLUS
INTRAVENOUS | Status: AC
  Filled 2023-03-09: qty 20

## 2023-03-09 MED ORDER — KETOROLAC TROMETHAMINE 30 MG/ML IJ SOLN: 30.0000 mg | Freq: Once | INTRAMUSCULAR | Status: DC | PRN

## 2023-03-09 MED ORDER — FENTANYL CITRATE (PF) 100 MCG/2ML IJ SOLN
25.0000 ug | INTRAMUSCULAR | Status: DC | PRN
  Administered 2023-03-09: 25 ug via INTRAVENOUS

## 2023-03-09 MED ORDER — SCOPOLAMINE 1 MG/3DAYS TD PT72
MEDICATED_PATCH | TRANSDERMAL | Status: AC
  Filled 2023-03-09: qty 1

## 2023-03-09 MED ORDER — PHENYLEPHRINE 80 MCG/ML (10ML) SYRINGE FOR IV PUSH (FOR BLOOD PRESSURE SUPPORT)
PREFILLED_SYRINGE | INTRAVENOUS | Status: DC | PRN
  Administered 2023-03-09: 160 ug via INTRAVENOUS
  Administered 2023-03-09: 80 ug via INTRAVENOUS

## 2023-03-09 MED ORDER — DEXMEDETOMIDINE HCL IN NACL 200 MCG/50ML IV SOLN
INTRAVENOUS | Status: DC | PRN
  Administered 2023-03-09: 8 ug via INTRAVENOUS

## 2023-03-09 MED ORDER — POVIDONE-IODINE 10 % EX SWAB: 2.0000 | Freq: Once | CUTANEOUS | Status: DC

## 2023-03-09 MED ORDER — ACETAMINOPHEN 500 MG PO TABS
ORAL_TABLET | ORAL | Status: AC
  Filled 2023-03-09: qty 2

## 2023-03-09 MED ORDER — STERILE WATER FOR IRRIGATION IR SOLN
Status: DC | PRN
  Administered 2023-03-09: 500 mL

## 2023-03-09 MED ORDER — OXYCODONE HCL 5 MG/5ML PO SOLN: 5.0000 mg | Freq: Once | ORAL | Status: DC | PRN

## 2023-03-09 MED ORDER — DEXAMETHASONE SODIUM PHOSPHATE 10 MG/ML IJ SOLN
INTRAMUSCULAR | Status: AC
  Filled 2023-03-09: qty 1

## 2023-03-09 MED ORDER — FENTANYL CITRATE (PF) 100 MCG/2ML IJ SOLN
INTRAMUSCULAR | Status: AC
  Filled 2023-03-09: qty 2

## 2023-03-09 MED ORDER — PHENYLEPHRINE 80 MCG/ML (10ML) SYRINGE FOR IV PUSH (FOR BLOOD PRESSURE SUPPORT)
PREFILLED_SYRINGE | INTRAVENOUS | Status: AC
Start: 2023-03-09 — End: ?
  Filled 2023-03-09: qty 10

## 2023-03-09 MED ORDER — AMISULPRIDE (ANTIEMETIC) 5 MG/2ML IV SOLN
10.0000 mg | Freq: Once | INTRAVENOUS | Status: DC | PRN
Start: 2023-03-09 — End: 2023-03-09

## 2023-03-09 MED ORDER — OXYCODONE HCL 5 MG PO TABS: 5.0000 mg | ORAL_TABLET | Freq: Once | ORAL | Status: DC | PRN

## 2023-03-09 MED ORDER — ONDANSETRON HCL 4 MG/2ML IJ SOLN
INTRAMUSCULAR | Status: DC | PRN
  Administered 2023-03-09: 4 mg via INTRAVENOUS

## 2023-03-09 MED ORDER — SODIUM CHLORIDE 0.9 % IV SOLN: INTRAVENOUS | Status: DC

## 2023-03-09 MED ORDER — SCOPOLAMINE 1 MG/3DAYS TD PT72
1.0000 | MEDICATED_PATCH | TRANSDERMAL | Status: DC
  Administered 2023-03-09: 1.5 mg via TRANSDERMAL

## 2023-03-09 MED ORDER — IBUPROFEN 600 MG PO TABS: 600.0000 mg | ORAL_TABLET | Freq: Four times a day (QID) | ORAL | 1 refills | Status: AC | PRN

## 2023-03-09 MED ORDER — ONDANSETRON HCL 4 MG/2ML IJ SOLN
INTRAMUSCULAR | Status: AC
  Filled 2023-03-09: qty 2

## 2023-03-09 MED ORDER — KETOROLAC TROMETHAMINE 30 MG/ML IJ SOLN
INTRAMUSCULAR | Status: DC | PRN
  Administered 2023-03-09: 30 mg via INTRAVENOUS

## 2023-03-09 MED ORDER — PROPOFOL 10 MG/ML IV BOLUS
INTRAVENOUS | Status: DC | PRN
  Administered 2023-03-09: 180 mg via INTRAVENOUS

## 2023-03-09 MED ORDER — LACTATED RINGERS IV SOLN
INTRAVENOUS | Status: DC
Start: 2023-03-09 — End: 2023-03-09

## 2023-03-09 MED ORDER — KETOROLAC TROMETHAMINE 30 MG/ML IJ SOLN
30.0000 mg | Freq: Once | INTRAMUSCULAR | Status: DC
Start: 2023-03-09 — End: 2023-03-09

## 2023-03-09 MED ORDER — FENTANYL CITRATE (PF) 100 MCG/2ML IJ SOLN
INTRAMUSCULAR | Status: DC | PRN
  Administered 2023-03-09: 100 ug via INTRAVENOUS

## 2023-03-09 MED ORDER — DEXAMETHASONE SODIUM PHOSPHATE 10 MG/ML IJ SOLN
INTRAMUSCULAR | Status: DC | PRN
  Administered 2023-03-09: 10 mg via INTRAVENOUS

## 2023-03-09 MED ORDER — LACTATED RINGERS IV SOLN: INTRAVENOUS | Status: DC

## 2023-03-09 MED ORDER — MIDAZOLAM HCL 5 MG/5ML IJ SOLN
INTRAMUSCULAR | Status: DC | PRN
  Administered 2023-03-09: 2 mg via INTRAVENOUS

## 2023-03-09 MED ORDER — OXYCODONE-ACETAMINOPHEN 5-325 MG PO TABS: 1.0000 | ORAL_TABLET | ORAL | 0 refills | Status: AC | PRN

## 2023-03-09 MED ORDER — BUPIVACAINE HCL (PF) 0.25 % IJ SOLN
INTRAMUSCULAR | Status: DC | PRN
  Administered 2023-03-09: 10 mL

## 2023-03-09 SURGICAL SUPPLY — 37 items
CABLE HIGH FREQUENCY MONO STRZ (ELECTRODE) IMPLANT
CATH ROBINSON RED A/P 16FR (CATHETERS) IMPLANT
COVER MAYO STAND STRL (DRAPES) ×1 IMPLANT
DERMABOND ADVANCED .7 DNX12 (GAUZE/BANDAGES/DRESSINGS) ×1 IMPLANT
DRAPE SURG IRRIG POUCH 19X23 (DRAPES) ×1 IMPLANT
DRSG OPSITE POSTOP 3X4 (GAUZE/BANDAGES/DRESSINGS) IMPLANT
DURAPREP 26ML APPLICATOR (WOUND CARE) ×1 IMPLANT
GAUZE 4X4 16PLY ~~LOC~~+RFID DBL (SPONGE) ×2 IMPLANT
GLOVE BIO SURGEON STRL SZ 6.5 (GLOVE) ×2 IMPLANT
GLOVE BIOGEL PI IND STRL 7.0 (GLOVE) IMPLANT
GOWN STRL REUS W/TWL LRG LVL3 (GOWN DISPOSABLE) ×3 IMPLANT
IRRIG SUCT STRYKERFLOW 2 WTIP (MISCELLANEOUS)
IRRIGATION SUCT STRKRFLW 2 WTP (MISCELLANEOUS) ×1 IMPLANT
KIT PINK PAD W/HEAD ARE REST (MISCELLANEOUS) ×1 IMPLANT
KIT PINK PAD W/HEAD ARM REST (MISCELLANEOUS) ×1 IMPLANT
KIT TURNOVER CYSTO (KITS) ×1 IMPLANT
MANIPULATOR UTERINE 7CM CLEARV (MISCELLANEOUS) ×1 IMPLANT
NS IRRIG 1000ML POUR BTL (IV SOLUTION) ×1 IMPLANT
PACK LAPAROSCOPY BASIN (CUSTOM PROCEDURE TRAY) ×1 IMPLANT
PROTECTOR NERVE ULNAR (MISCELLANEOUS) ×2 IMPLANT
SET TUBE SMOKE EVAC HIGH FLOW (TUBING) ×1 IMPLANT
SHEARS HARMONIC 36 ACE (MISCELLANEOUS) IMPLANT
SLEEVE SCD COMPRESS KNEE MED (STOCKING) ×1 IMPLANT
SLEEVE Z-THREAD 5X100MM (TROCAR) ×1 IMPLANT
SOL ELECTROSURG ANTI STICK (MISCELLANEOUS)
SOLUTION ELECTROSURG ANTI STCK (MISCELLANEOUS) IMPLANT
SPIKE FLUID TRANSFER (MISCELLANEOUS) ×1 IMPLANT
SUT VIC AB 3-0 PS2 18XBRD (SUTURE) ×1 IMPLANT
SUT VICRYL 0 UR6 27IN ABS (SUTURE) IMPLANT
SYS BAG RETRIEVAL 10MM (BASKET)
SYSTEM BAG RETRIEVAL 10MM (BASKET) IMPLANT
SYSTEM CARTER THOMASON II (TROCAR) IMPLANT
TOWEL OR 17X24 6PK STRL BLUE (TOWEL DISPOSABLE) ×2 IMPLANT
TROCAR Z-THREAD FIOS 11X100 BL (TROCAR) IMPLANT
TROCAR Z-THREAD FIOS 5X100MM (TROCAR) ×2 IMPLANT
WARMER LAPAROSCOPE (MISCELLANEOUS) ×1 IMPLANT
WATER STERILE IRR 500ML POUR (IV SOLUTION) IMPLANT

## 2023-03-09 NOTE — Anesthesia Postprocedure Evaluation (Signed)
Anesthesia Post Note  Patient: April Charles  Procedure(s) Performed: LAPAROSCOPIC UNILATERAL SALPINGECTOMY (Right: Pelvis)     Patient location during evaluation: PACU Anesthesia Type: General Level of consciousness: awake Pain management: pain level controlled Vital Signs Assessment: post-procedure vital signs reviewed and stable Respiratory status: spontaneous breathing, nonlabored ventilation and respiratory function stable Cardiovascular status: blood pressure returned to baseline and stable Postop Assessment: no apparent nausea or vomiting Anesthetic complications: no   No notable events documented.  Last Vitals:  Vitals:   03/09/23 0930 03/09/23 1015  BP: 104/74 102/74  Pulse: 66   Resp: 12 14  Temp:  36.4 C  SpO2: 99% 98%    Last Pain:  Vitals:   03/09/23 1015  TempSrc:   PainSc: 0-No pain                 Paublo Warshawsky P Skippy Marhefka

## 2023-03-09 NOTE — Anesthesia Procedure Notes (Addendum)
Procedure Name: Intubation Date/Time: 03/09/2023 7:45 AM  Performed by: Bishop Limbo, CRNAPre-anesthesia Checklist: Patient identified, Emergency Drugs available, Suction available and Patient being monitored Patient Re-evaluated:Patient Re-evaluated prior to induction Oxygen Delivery Method: Circle System Utilized Preoxygenation: Pre-oxygenation with 100% oxygen Induction Type: IV induction Ventilation: Mask ventilation without difficulty Laryngoscope Size: Mac and 3 Grade View: Grade I Tube type: Oral Tube size: 7.0 mm Number of attempts: 1 Airway Equipment and Method: Stylet Placement Confirmation: ETT inserted through vocal cords under direct vision, positive ETCO2 and breath sounds checked- equal and bilateral Secured at: 21 cm Tube secured with: Tape Dental Injury: Teeth and Oropharynx as per pre-operative assessment

## 2023-03-09 NOTE — Transfer of Care (Signed)
Immediate Anesthesia Transfer of Care Note  Patient: April Charles  Procedure(s) Performed: LAPAROSCOPIC UNILATERAL SALPINGECTOMY (Right: Pelvis)  Patient Location: PACU  Anesthesia Type:General  Level of Consciousness: awake and patient cooperative  Airway & Oxygen Therapy: Patient Spontanous Breathing  Post-op Assessment: Report given to RN and Post -op Vital signs reviewed and stable  Post vital signs: Reviewed and stable  Last Vitals:  Vitals Value Taken Time  BP 103/75 03/09/23 0847  Temp    Pulse 73 03/09/23 0848  Resp 12 03/09/23 0848  SpO2 97 % 03/09/23 0848  Vitals shown include unfiled device data.  Last Pain:  Vitals:   03/09/23 0606  TempSrc: Oral  PainSc: 0-No pain      Patients Stated Pain Goal: 7 (03/09/23 0606)  Complications: No notable events documented.

## 2023-03-09 NOTE — Anesthesia Preprocedure Evaluation (Addendum)
Anesthesia Evaluation  Patient identified by MRN, date of birth, ID band Patient awake    Reviewed: Allergy & Precautions, NPO status , Patient's Chart, lab work & pertinent test results  Airway Mallampati: II  TM Distance: >3 FB Neck ROM: Full    Dental no notable dental hx.    Pulmonary neg pulmonary ROS   Pulmonary exam normal        Cardiovascular negative cardio ROS Normal cardiovascular exam     Neuro/Psych negative neurological ROS  negative psych ROS   GI/Hepatic negative GI ROS, Neg liver ROS,,,  Endo/Other  negative endocrine ROS    Renal/GU negative Renal ROS     Musculoskeletal negative musculoskeletal ROS (+)    Abdominal   Peds  Hematology negative hematology ROS (+)   Anesthesia Other Findings Desires sterilization   Reproductive/Obstetrics Hcg negative                             Anesthesia Physical Anesthesia Plan  ASA: 2  Anesthesia Plan: General   Post-op Pain Management:    Induction: Intravenous  PONV Risk Score and Plan: Ondansetron, Dexamethasone, Midazolam, Scopolamine patch - Pre-op and Treatment may vary due to age or medical condition  Airway Management Planned: Oral ETT  Additional Equipment:   Intra-op Plan:   Post-operative Plan: Extubation in OR  Informed Consent: I have reviewed the patients History and Physical, chart, labs and discussed the procedure including the risks, benefits and alternatives for the proposed anesthesia with the patient or authorized representative who has indicated his/her understanding and acceptance.     Dental advisory given  Plan Discussed with: CRNA  Anesthesia Plan Comments:        Anesthesia Quick Evaluation

## 2023-03-09 NOTE — Interval H&P Note (Signed)
History and Physical Interval Note: Pt seen. No change since H/P done  Consent confirmed  To OR when ready   03/09/2023 7:25 AM  April Charles  has presented today for surgery, with the diagnosis of sterilization Z30.2.  The various methods of treatment have been discussed with the patient and family. After consideration of risks, benefits and other options for treatment, the patient has consented to  Procedure(s): LAPAROSCOPIC UNILATERAL SALPINGECTOMY (Right) as a surgical intervention.  The patient's history has been reviewed, patient examined, no change in status, stable for surgery.  I have reviewed the patient's chart and labs.  Questions were answered to the patient's satisfaction.     [name]

## 2023-03-09 NOTE — Op Note (Signed)
Operative Note    Preoperative Diagnosis Desires permanent sterilization  History of left salpingectomy History of right salpingostomy    Postoperative Diagnosis: Same    Procedure: Laparoscopic right salpingectomy    Surgeon: Britt Bottom DO  Assist: Webb Silversmith RNFA  Anesthesia: General   Fluids: LR EBL: 5ml UOP: voided prior to OR   Findings: Grossly normal uterus and ovaries. Absent left fallopian tube. Grossly normal right fallopian tube    Specimen: to pathology then to patient    Procedure Note  Patient was taken to the operating room where general anesthesia was administered without difficulty. She was then prepped and draped in the normal sterile fashion while in the dorsal lithotomy position. An appropriate timeout was performed.  A speculum was then placed within the vagina and a ClearVue uterine manipulator placed   Attention was then turned to the patient's abdomen after draping where the infraumbilicus was incised and a 5mm scope placed under direct visualization. Gas flow was then applied and a pneumoperitoneum obtained with approximate 3 L of CO2 gas. With patient in Trendelenburg the uterus and tube and ovaries were inspected. Left tube was confirmed to be absent, rest of anatomy was grossly normal. Two more 5mm trocars were placed in the left and right lower quadrants under direct visualization.  The right fallopian tube was easily identified and followed to its  fimbriated end. No gross abnormalities noted.  A harmonic cautery was then used to excise the right fallopian tube in its entirety. Small oozing area near the cornua was cauterized with great hemostasis. Good tissue color was noted in the ovary.   The patient was then flattened and all instruments removed from the abdomen. Abdominal gas was allowed to escape and through the operative scope and after a few deep breaths administered by CRNA, the trocar was removed.  The incisions were closed  with 4-0 vicryl and dermabond. The Clearvue manipulator was removed. Patient was then awakened and taken to the recovery room in stable condition. Counts were correct per nursing x 2

## 2023-03-09 NOTE — Discharge Instructions (Addendum)
Call office with any concerns [phone]       No acetaminophen/Tylenol until after 12:09 pm today if needed.  No ibuprofen, Advil, Aleve, Motrin, ketorolac, meloxicam, naproxen, or other NSAIDS until after 2:24 pm today if needed.     Post Anesthesia Home Care Instructions  Activity: Get plenty of rest for the remainder of the day. A responsible individual must stay with you for 24 hours following the procedure.  For the next 24 hours, DO NOT: -Drive a car -Advertising copywriter -Drink alcoholic beverages -Take any medication unless instructed by your physician -Make any legal decisions or sign important papers.  Meals: Start with liquid foods such as gelatin or soup. Progress to regular foods as tolerated. Avoid greasy, spicy, heavy foods. If nausea and/or vomiting occur, drink only clear liquids until the nausea and/or vomiting subsides. Call your physician if vomiting continues.  Special Instructions/Symptoms: Your throat may feel dry or sore from the anesthesia or the breathing tube placed in your throat during surgery. If this causes discomfort, gargle with warm salt water. The discomfort should disappear within 24 hours.  If you had a scopolamine patch placed behind your ear for the management of post- operative nausea and/or vomiting:  1. The medication in the patch is effective for 72 hours, after which it should be removed.  Wrap patch in a tissue and discard in the trash. Wash hands thoroughly with soap and water. 2. You may remove the patch earlier than 72 hours if you experience unpleasant side effects which may include dry mouth, dizziness or visual disturbances. 3. Avoid touching the patch. Wash your hands with soap and water after contact with the patch.

## 2023-03-12 LAB — SURGICAL PATHOLOGY

## 2023-03-13 ENCOUNTER — Encounter (HOSPITAL_BASED_OUTPATIENT_CLINIC_OR_DEPARTMENT_OTHER): Payer: Self-pay | Admitting: Obstetrics and Gynecology
